# Patient Record
Sex: Male | Born: 1935 | Race: White | Hispanic: No | Marital: Married
Health system: Southern US, Community
[De-identification: ages and names within clinical notes are randomized; demographics above are authoritative.]

## PROBLEM LIST (undated history)

## (undated) DIAGNOSIS — I701 Atherosclerosis of renal artery: Secondary | ICD-10-CM

## (undated) DIAGNOSIS — I2510 Atherosclerotic heart disease of native coronary artery without angina pectoris: Secondary | ICD-10-CM

## (undated) DIAGNOSIS — E785 Hyperlipidemia, unspecified: Secondary | ICD-10-CM

## (undated) DIAGNOSIS — K859 Acute pancreatitis without necrosis or infection, unspecified: Secondary | ICD-10-CM

## (undated) DIAGNOSIS — I10 Essential (primary) hypertension: Secondary | ICD-10-CM

## (undated) DIAGNOSIS — I679 Cerebrovascular disease, unspecified: Secondary | ICD-10-CM

## (undated) DIAGNOSIS — I5030 Unspecified diastolic (congestive) heart failure: Secondary | ICD-10-CM

## (undated) HISTORY — DX: Unspecified diastolic (congestive) heart failure: I50.30

## (undated) HISTORY — PX: POLYPECTOMY: SHX149

## (undated) HISTORY — DX: Atherosclerotic heart disease of native coronary artery without angina pectoris: I25.10

## (undated) HISTORY — DX: Essential (primary) hypertension: I10

## (undated) HISTORY — DX: Cerebrovascular disease, unspecified: I67.9

## (undated) HISTORY — DX: Hyperlipidemia, unspecified: E78.5

## (undated) HISTORY — PX: CHOLECYSTECTOMY: SHX55

## (undated) HISTORY — DX: Atherosclerosis of renal artery: I70.1

## (undated) HISTORY — DX: Acute pancreatitis without necrosis or infection, unspecified: K85.90

## (undated) HISTORY — PX: INGUINAL HERNIA REPAIR: SUR1180

---

## 1995-10-26 HISTORY — PX: CORONARY ARTERY BYPASS GRAFT: SHX141

## 2003-05-23 ENCOUNTER — Ambulatory Visit (HOSPITAL_COMMUNITY): Admission: RE | Admit: 2003-05-23 | Discharge: 2003-05-23 | Payer: Self-pay | Admitting: Gastroenterology

## 2003-10-30 ENCOUNTER — Ambulatory Visit (HOSPITAL_COMMUNITY): Admission: RE | Admit: 2003-10-30 | Discharge: 2003-10-30 | Payer: Self-pay | Admitting: *Deleted

## 2003-12-10 ENCOUNTER — Inpatient Hospital Stay (HOSPITAL_COMMUNITY): Admission: EM | Admit: 2003-12-10 | Discharge: 2003-12-18 | Payer: Self-pay | Admitting: Emergency Medicine

## 2003-12-13 ENCOUNTER — Encounter: Payer: Self-pay | Admitting: Cardiology

## 2004-09-30 ENCOUNTER — Ambulatory Visit: Payer: Self-pay | Admitting: *Deleted

## 2004-10-27 ENCOUNTER — Ambulatory Visit: Payer: Self-pay | Admitting: *Deleted

## 2004-11-10 ENCOUNTER — Ambulatory Visit: Payer: Self-pay | Admitting: Internal Medicine

## 2005-01-06 ENCOUNTER — Ambulatory Visit: Payer: Self-pay | Admitting: *Deleted

## 2005-03-01 ENCOUNTER — Ambulatory Visit: Payer: Self-pay | Admitting: *Deleted

## 2005-09-30 ENCOUNTER — Ambulatory Visit: Payer: Self-pay | Admitting: Cardiology

## 2005-11-03 ENCOUNTER — Emergency Department (HOSPITAL_COMMUNITY): Admission: EM | Admit: 2005-11-03 | Discharge: 2005-11-03 | Payer: Self-pay | Admitting: Emergency Medicine

## 2005-11-05 ENCOUNTER — Ambulatory Visit: Payer: Self-pay | Admitting: Internal Medicine

## 2005-11-09 ENCOUNTER — Ambulatory Visit: Payer: Self-pay | Admitting: Cardiology

## 2005-12-01 ENCOUNTER — Ambulatory Visit: Payer: Self-pay | Admitting: Cardiology

## 2006-02-03 ENCOUNTER — Emergency Department (HOSPITAL_COMMUNITY): Admission: EM | Admit: 2006-02-03 | Discharge: 2006-02-03 | Payer: Self-pay | Admitting: Emergency Medicine

## 2006-02-18 ENCOUNTER — Ambulatory Visit: Payer: Self-pay

## 2006-10-03 ENCOUNTER — Ambulatory Visit: Payer: Self-pay | Admitting: Cardiology

## 2007-09-27 ENCOUNTER — Ambulatory Visit: Payer: Self-pay | Admitting: Cardiology

## 2007-09-27 LAB — CONVERTED CEMR LAB
Basophils Relative: 0.5 % (ref 0.0–1.0)
Eosinophils Relative: 3.2 % (ref 0.0–5.0)
Lymphocytes Relative: 22.5 % (ref 12.0–46.0)
Platelets: 96 10*3/uL — ABNORMAL LOW (ref 150–400)
RBC: 4.17 M/uL — ABNORMAL LOW (ref 4.22–5.81)
RDW: 13.7 % (ref 11.5–14.6)
WBC: 4.6 10*3/uL (ref 4.5–10.5)

## 2007-10-09 ENCOUNTER — Ambulatory Visit: Payer: Self-pay

## 2007-11-13 ENCOUNTER — Ambulatory Visit: Payer: Self-pay | Admitting: Oncology

## 2007-11-28 LAB — CBC WITH DIFFERENTIAL/PLATELET
Basophils Absolute: 0 10*3/uL (ref 0.0–0.1)
Eosinophils Absolute: 0.1 10*3/uL (ref 0.0–0.5)
HGB: 13.6 g/dL (ref 13.0–17.1)
LYMPH%: 16.3 % (ref 14.0–48.0)
MONO#: 0.2 10*3/uL (ref 0.1–0.9)
NEUT#: 3.7 10*3/uL (ref 1.5–6.5)
Platelets: 98 10*3/uL — ABNORMAL LOW (ref 145–400)
RBC: 4.29 10*6/uL (ref 4.20–5.71)
RDW: 14.2 % (ref 11.2–14.6)
WBC: 4.8 10*3/uL (ref 4.0–10.0)

## 2007-11-28 LAB — COMPREHENSIVE METABOLIC PANEL
Albumin: 4.6 g/dL (ref 3.5–5.2)
BUN: 22 mg/dL (ref 6–23)
CO2: 27 mEq/L (ref 19–32)
Glucose, Bld: 97 mg/dL (ref 70–99)
Potassium: 4.5 mEq/L (ref 3.5–5.3)
Sodium: 141 mEq/L (ref 135–145)
Total Protein: 7 g/dL (ref 6.0–8.3)

## 2007-11-28 LAB — LACTATE DEHYDROGENASE: LDH: 144 U/L (ref 94–250)

## 2008-01-03 ENCOUNTER — Ambulatory Visit: Payer: Self-pay | Admitting: Cardiology

## 2008-01-10 IMAGING — CR DG CERVICAL SPINE COMPLETE 4+V
6 series · 6 of 6 positions shown · non-contrast
Comparison: none

CLINICAL DATA: Syncopal episode, landed on back. 
 THORACIC SPINE - 2 VIEW:
 Two views of the thoracic spine were obtained.  The bones are diffusely osteopenic.  There is degenerative change present but no acute compression deformity is seen.

[w c-spine lat]
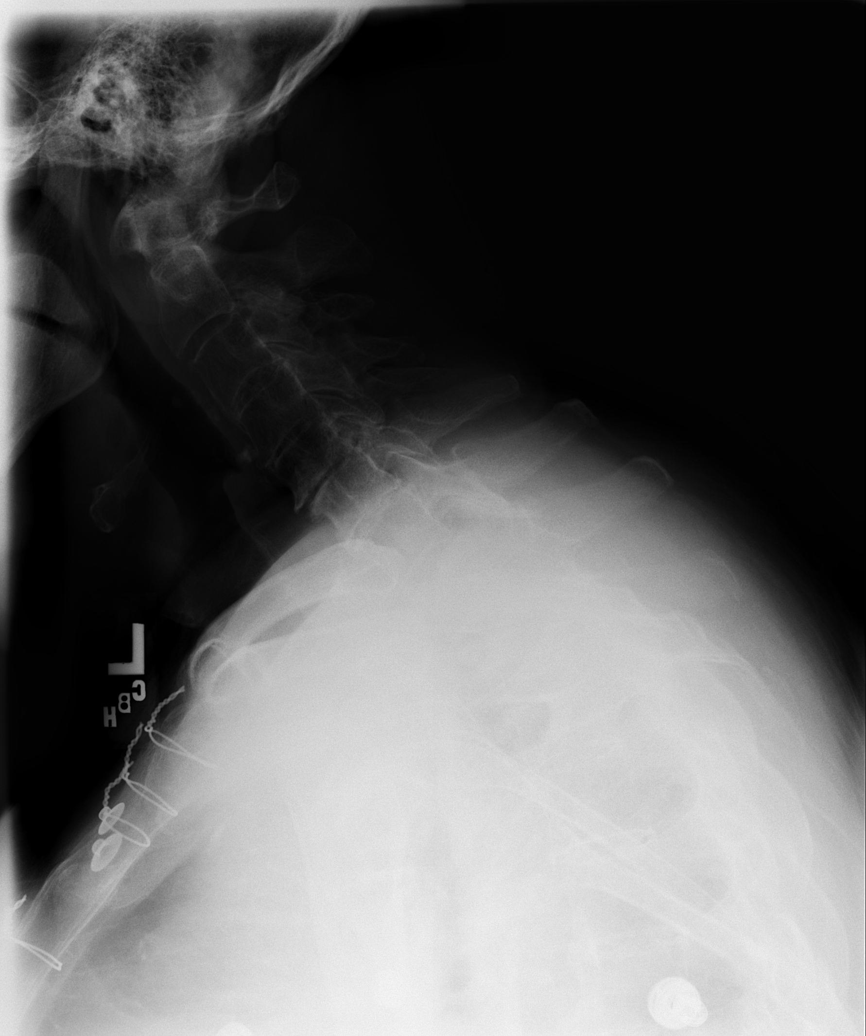

[w c-spine oblique (1 of 2)]
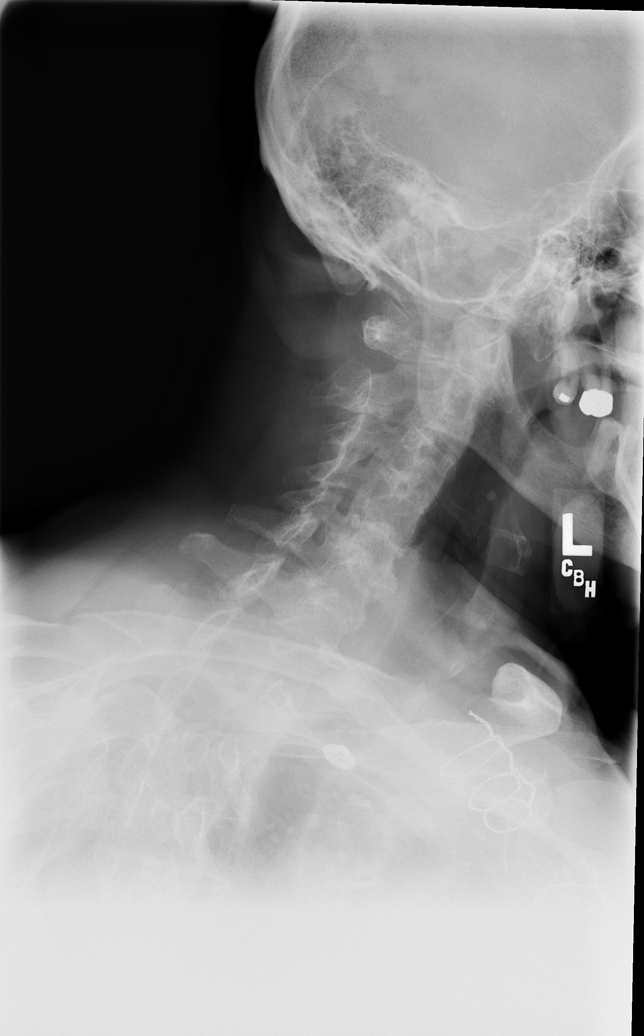

[w c-spine oblique (2 of 2)]
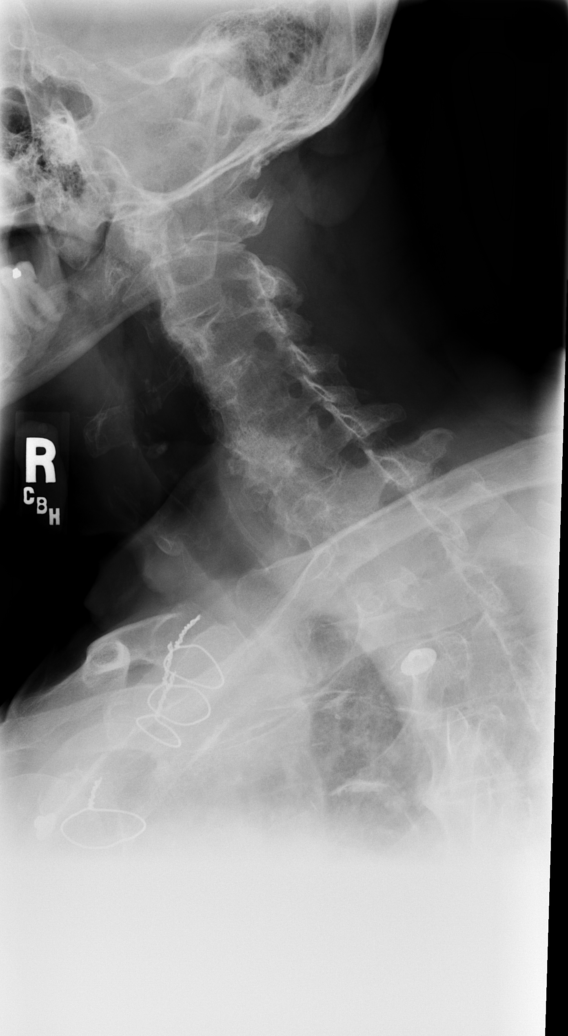

[w c-spine a.p.]
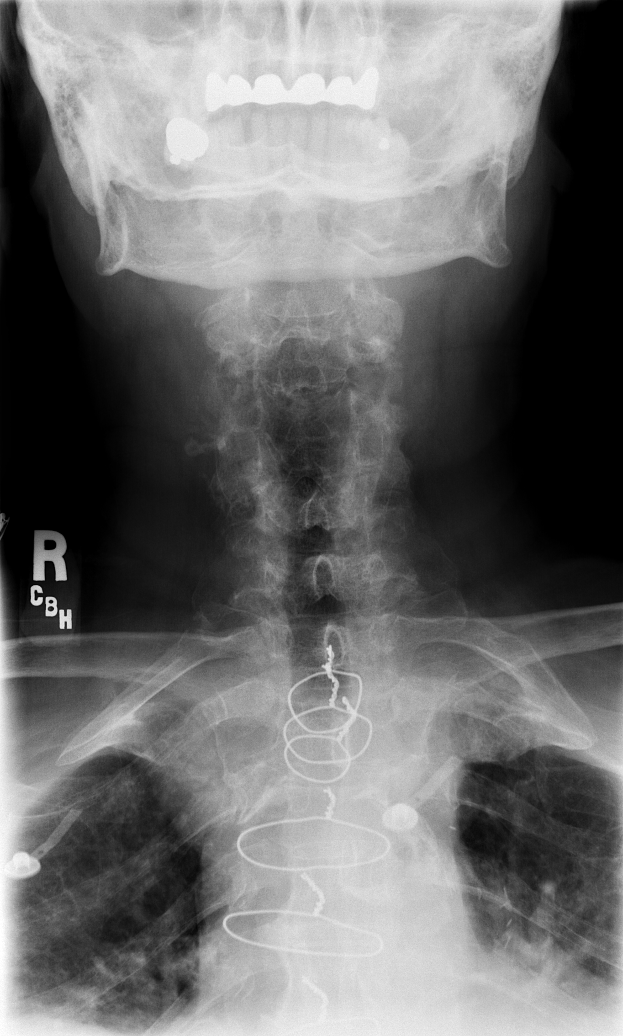

[w c-spine odontoid]
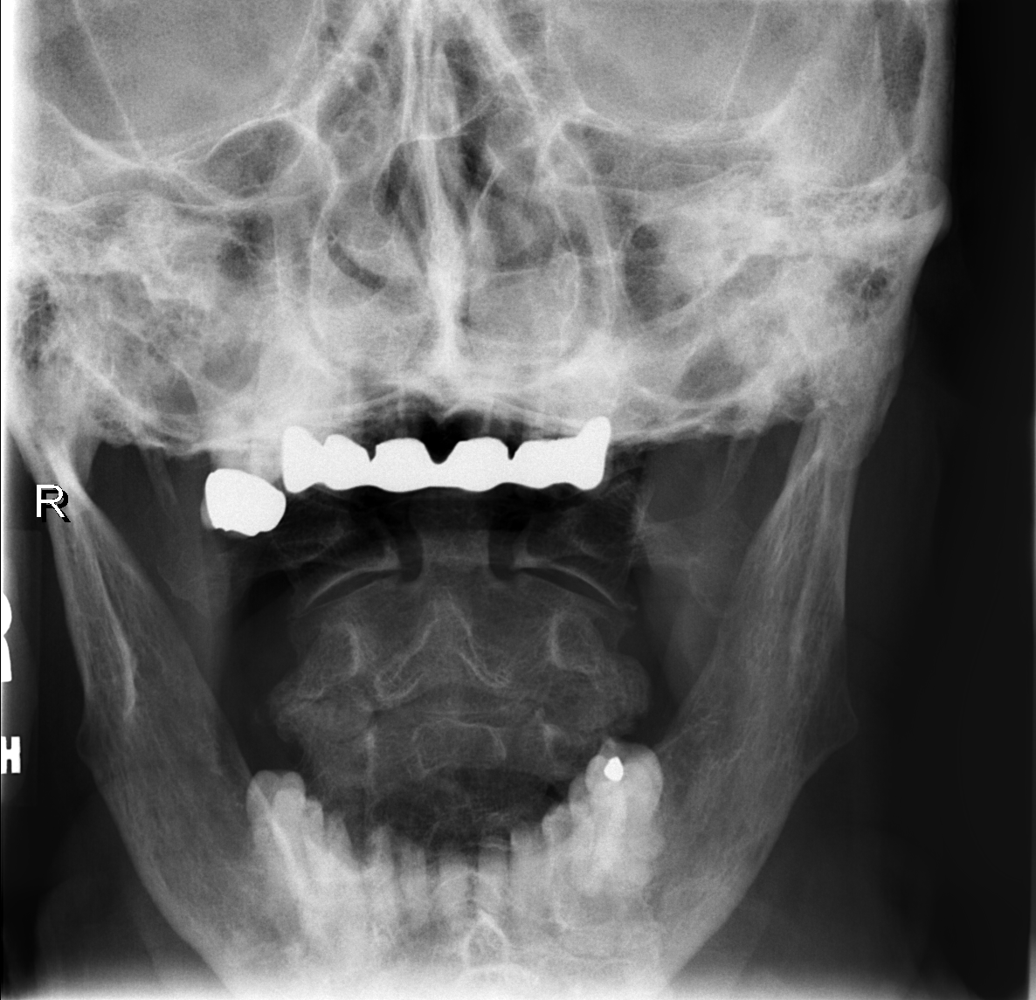

[w swimmers view]
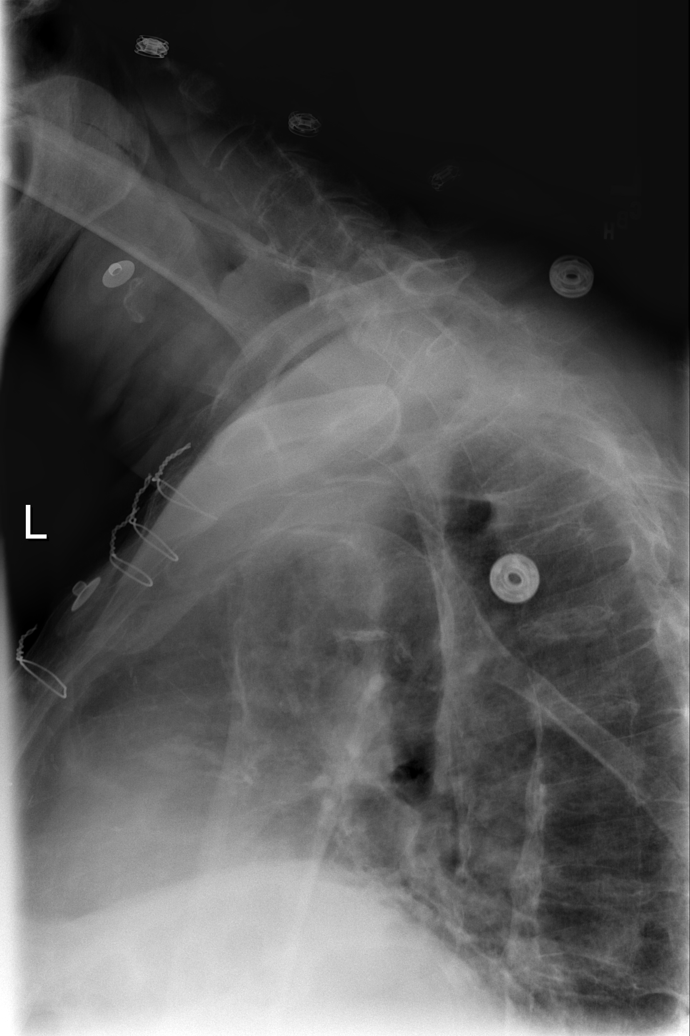

[6 of 6 positions shown; findings below may reference images not displayed]

IMPRESSION: Diffuse osteopenia.  No acute compression deformity. 
 CERVICAL SPINE - 5 VIEW:
 Five views of the cervical spine were obtained. There is degenerative disk disease particularly at C4-5, C5-6 and C6-7 levels.  The cervical vertebrae are in normal alignment.  On oblique views, there is foraminal narrowing particularly at C5-6 bilaterally.  The odontoid process is intact.
IMPRESSION: No acute abnormality.  Degenerative change at C4-5, C5-6, and C6-7 with foraminal narrowing particularly C5-6 bilaterally.

## 2008-01-24 ENCOUNTER — Ambulatory Visit: Payer: Self-pay

## 2008-03-25 ENCOUNTER — Ambulatory Visit: Payer: Self-pay | Admitting: Oncology

## 2008-03-27 LAB — COMPREHENSIVE METABOLIC PANEL
AST: 18 U/L (ref 0–37)
Albumin: 4.6 g/dL (ref 3.5–5.2)
Alkaline Phosphatase: 71 U/L (ref 39–117)
BUN: 26 mg/dL — ABNORMAL HIGH (ref 6–23)
Creatinine, Ser: 1.42 mg/dL (ref 0.40–1.50)
Glucose, Bld: 110 mg/dL — ABNORMAL HIGH (ref 70–99)
Potassium: 4.8 mEq/L (ref 3.5–5.3)
Total Bilirubin: 1.3 mg/dL — ABNORMAL HIGH (ref 0.3–1.2)

## 2008-03-27 LAB — CBC WITH DIFFERENTIAL/PLATELET
Basophils Absolute: 0 10*3/uL (ref 0.0–0.1)
Eosinophils Absolute: 0.1 10*3/uL (ref 0.0–0.5)
HCT: 37.2 % — ABNORMAL LOW (ref 38.7–49.9)
HGB: 13.3 g/dL (ref 13.0–17.1)
MCH: 31.3 pg (ref 28.0–33.4)
MONO#: 0.2 10*3/uL (ref 0.1–0.9)
NEUT#: 3 10*3/uL (ref 1.5–6.5)
NEUT%: 73.4 % (ref 40.0–75.0)
RDW: 14.5 % (ref 11.2–14.6)
WBC: 4.1 10*3/uL (ref 4.0–10.0)
lymph#: 0.8 10*3/uL — ABNORMAL LOW (ref 0.9–3.3)

## 2008-07-03 ENCOUNTER — Ambulatory Visit: Payer: Self-pay | Admitting: Cardiology

## 2008-08-09 ENCOUNTER — Ambulatory Visit: Payer: Self-pay

## 2008-09-24 ENCOUNTER — Ambulatory Visit: Payer: Self-pay | Admitting: Oncology

## 2008-11-19 DIAGNOSIS — I2510 Atherosclerotic heart disease of native coronary artery without angina pectoris: Secondary | ICD-10-CM | POA: Insufficient documentation

## 2008-11-19 DIAGNOSIS — I6529 Occlusion and stenosis of unspecified carotid artery: Secondary | ICD-10-CM

## 2008-11-19 DIAGNOSIS — E7800 Pure hypercholesterolemia, unspecified: Secondary | ICD-10-CM

## 2008-11-19 DIAGNOSIS — I701 Atherosclerosis of renal artery: Secondary | ICD-10-CM | POA: Insufficient documentation

## 2008-11-19 DIAGNOSIS — D696 Thrombocytopenia, unspecified: Secondary | ICD-10-CM | POA: Insufficient documentation

## 2008-11-19 DIAGNOSIS — I10 Essential (primary) hypertension: Secondary | ICD-10-CM | POA: Insufficient documentation

## 2008-11-19 DIAGNOSIS — I509 Heart failure, unspecified: Secondary | ICD-10-CM | POA: Insufficient documentation

## 2009-01-17 ENCOUNTER — Encounter: Payer: Self-pay | Admitting: Cardiology

## 2009-02-19 ENCOUNTER — Encounter: Payer: Self-pay | Admitting: Cardiology

## 2009-02-19 ENCOUNTER — Ambulatory Visit: Payer: Self-pay | Admitting: Cardiology

## 2009-02-19 DIAGNOSIS — R0602 Shortness of breath: Secondary | ICD-10-CM

## 2009-02-24 ENCOUNTER — Telehealth (INDEPENDENT_AMBULATORY_CARE_PROVIDER_SITE_OTHER): Payer: Self-pay | Admitting: *Deleted

## 2009-02-25 ENCOUNTER — Ambulatory Visit: Payer: Self-pay | Admitting: Cardiology

## 2009-02-25 ENCOUNTER — Ambulatory Visit: Payer: Self-pay

## 2009-02-26 ENCOUNTER — Encounter (INDEPENDENT_AMBULATORY_CARE_PROVIDER_SITE_OTHER): Payer: Self-pay | Admitting: *Deleted

## 2009-04-23 ENCOUNTER — Encounter: Payer: Self-pay | Admitting: Cardiology

## 2009-04-23 ENCOUNTER — Ambulatory Visit: Payer: Self-pay | Admitting: Cardiology

## 2009-04-23 DIAGNOSIS — R079 Chest pain, unspecified: Secondary | ICD-10-CM

## 2009-05-02 ENCOUNTER — Encounter: Payer: Self-pay | Admitting: Cardiology

## 2009-08-12 ENCOUNTER — Encounter: Payer: Self-pay | Admitting: Cardiology

## 2009-08-12 ENCOUNTER — Ambulatory Visit: Payer: Self-pay

## 2009-08-14 ENCOUNTER — Encounter: Payer: Self-pay | Admitting: Cardiology

## 2009-08-29 ENCOUNTER — Encounter: Payer: Self-pay | Admitting: Cardiology

## 2009-09-24 ENCOUNTER — Ambulatory Visit: Payer: Self-pay | Admitting: Cardiology

## 2009-09-24 ENCOUNTER — Encounter: Payer: Self-pay | Admitting: Cardiology

## 2010-04-08 ENCOUNTER — Ambulatory Visit: Payer: Self-pay | Admitting: Cardiology

## 2010-04-08 ENCOUNTER — Encounter: Payer: Self-pay | Admitting: Cardiology

## 2010-08-11 ENCOUNTER — Encounter: Payer: Self-pay | Admitting: Cardiology

## 2010-08-12 ENCOUNTER — Encounter: Payer: Self-pay | Admitting: Cardiology

## 2010-08-12 ENCOUNTER — Ambulatory Visit: Payer: Self-pay

## 2010-08-27 ENCOUNTER — Encounter: Payer: Self-pay | Admitting: Cardiology

## 2010-11-04 ENCOUNTER — Ambulatory Visit
Admission: RE | Admit: 2010-11-04 | Discharge: 2010-11-04 | Payer: Self-pay | Source: Home / Self Care | Attending: Cardiology | Admitting: Cardiology

## 2010-11-04 ENCOUNTER — Encounter: Payer: Self-pay | Admitting: Cardiology

## 2010-11-04 DIAGNOSIS — R5383 Other fatigue: Secondary | ICD-10-CM

## 2010-11-04 DIAGNOSIS — N259 Disorder resulting from impaired renal tubular function, unspecified: Secondary | ICD-10-CM | POA: Insufficient documentation

## 2010-11-04 DIAGNOSIS — R5381 Other malaise: Secondary | ICD-10-CM | POA: Insufficient documentation

## 2010-11-22 LAB — CONVERTED CEMR LAB
BUN: 20 mg/dL (ref 6–23)
CO2: 30 meq/L (ref 19–32)
Chloride: 108 meq/L (ref 96–112)
Glucose, Bld: 104 mg/dL — ABNORMAL HIGH (ref 70–99)
Potassium: 4.4 meq/L (ref 3.5–5.1)

## 2010-11-24 NOTE — Miscellaneous (Signed)
Summary: Orders Update  Clinical Lists Changes  Orders: Added new Test order of Carotid Duplex (Carotid Duplex) - Signed 

## 2010-11-24 NOTE — Assessment & Plan Note (Signed)
Summary: [name]   Visit Type:  Follow-up  CC:  Tiredness.  History of Present Illness: [name] is a very pleasant 77 year old gentleman who has a history of coronary artery disease, status post coronary bypassing graft as well as 70% left renal artery.  He also has moderate cerebrovascular disease.  His last  carotid Dopplers were performed in October of 2010.  He had a 40-59% right and 0-39% left stenosis. Followup was recommended in one year. Last Celine Ahr was performed on Feb 25, 2009. His ejection fraction was 61% and there was inferior thinning but no sign of scar or ischemia. I last saw him in December of 2010. Since then he denies dyspnea, chest pain, palpitations or syncope. He does have some fatigue.  Current Medications (verified): 1)  Isosorbide Mononitrate Cr 60 Mg Xr24h-Tab (Isosorbide Mononitrate) .... One Tablet Daily 2)  Nexium 40 Mg Cpdr (Esomeprazole Magnesium) .... One Tablet Daily 3)  Felodipine 10 Mg Xr24h-Tab (Felodipine) .... One Tablet Daily 4)  Terazosin Hcl 10 Mg Caps (Terazosin Hcl) .... One Tablet Daily 5)  Pravachol 40 Mg Tabs (Pravastatin Sodium) .... One Tablet Daily 6)  Micardis Hct 40-12.5 Mg Tabs (Telmisartan-Hctz) .... Two Tablets By Mouth Once Daily 7)  Aspirin 81 Mg Tbec (Aspirin) .... Take One Tablet By Mouth Daily 8)  Cvs Folic Acid 400 Mcg Tabs (Folic Acid) .... One Tablet Daily 9)  Cosopt 2-0.5 % Soln (Dorzolamide-Timolol) .... One Gtt Both Eyes Two Times A Day  Allergies (verified): No Known Drug Allergies  Past History:  Past Medical History: Reviewed history from 11/19/2008 and no changes required. CAD Cerebrovascular Disease Congestive Heart Failure diastolic Hyperlipidemia Hypertension glaucoma Renal artery stenosis Pancreatitis  Past Surgical History: Reviewed history from 11/19/2008 and no changes required. s/p cabg 1997 (LIMA to the LAD, saphenous vein graft to the ramus intermedius, saphenous vein graft to the first  OM, saphenous vein graft to the second OM, something graft to the PDA) s/p cholecystectomy hx r and l inguinal hernia  repair hx polypectomy  Social History: Reviewed history from 09/24/2009 and no changes required. Retired  Married  Alcohol Use - no Drug Use - no children 1 son 2 stepdaughters Tobacco Use - No.   Review of Systems       no fevers or chills, productive cough, hemoptysis, dysphasia, odynophagia, melena, hematochezia, dysuria, hematuria, rash, seizure activity, orthopnea, PND, pedal edema, claudication. Remaining systems are negative.   Vital Signs:  Patient profile:   77 year old male Height:      67 inches Weight:      170 pounds BMI:     26.72 Pulse rate:   61 / minute Pulse rhythm:   regular Resp:     18 per minute BP sitting:   142 / 70  (right arm) Cuff size:   large  Vitals Entered By: Vikki Ports (April 08, 2010 4:38 PM)  Physical Exam  General:  Well-developed well-nourished in no acute distress.  Skin is warm and dry.  HEENT is normal.  Neck is supple. No thyromegaly. Bilateral bruits Chest is clear to auscultation with normal expansion.  Cardiovascular exam is regular rate and rhythm. 2/6 SEM Abdominal exam nontender or distended. No masses palpated. Extremities show trace edema. neuro grossly intact    EKG  Procedure date:  04/08/2010  Findings:      Sinus rhythm at a rate of 61. Occasional PACs. Nonspecific ST changes.  Impression & Recommendations:  Problem # 1:  CAROTID ARTERY STENOSIS (  ICD-433.10) Continue aspirin and statin. I discussed followup studies with the patient today. Given his age I am not convinced there is a benefit in continuing this. He also most likely would not consider surgery in the future. We will therefore treat medically with no followup carotid Dopplers. His updated medication list for this problem includes:    Aspirin 81 Mg Tbec (Aspirin) .Marland Kitchen... Take one tablet by mouth daily  Problem # 2:  RENAL  ARTERY STENOSIS (ICD-440.1) Continue aspirin and statin.  Problem # 3:  ESSENTIAL HYPERTENSION, BENIGN (ICD-401.1) Blood pressure controlled on present medications. Will continue. His updated medication list for this problem includes:    Felodipine 10 Mg Xr24h-tab (Felodipine) ..... One tablet daily    Terazosin Hcl 10 Mg Caps (Terazosin hcl) ..... One tablet daily    Micardis Hct 40-12.5 Mg Tabs (Telmisartan-hctz) .Marland Kitchen..Marland Kitchen Two tablets by mouth once daily    Aspirin 81 Mg Tbec (Aspirin) .Marland Kitchen... Take one tablet by mouth daily  Problem # 4:  HYPERCHOLESTEROLEMIA-PURE (ICD-272.0) Continue statin. Lipids and liver monitored by primary care. His updated medication list for this problem includes:    Pravachol 40 Mg Tabs (Pravastatin sodium) ..... One tablet daily  Problem # 5:  CORONARY ATHEROSCLEROSIS NATIVE CORONARY ARTERY (ICD-414.01) Continue aspirin and statin. Last Myoview low risk. His updated medication list for this problem includes:    Isosorbide Mononitrate Cr 60 Mg Xr24h-tab (Isosorbide mononitrate) ..... One tablet daily    Felodipine 10 Mg Xr24h-tab (Felodipine) ..... One tablet daily    Aspirin 81 Mg Tbec (Aspirin) .Marland Kitchen... Take one tablet by mouth daily

## 2010-11-26 NOTE — Assessment & Plan Note (Signed)
Summary: [facility]   Visit Type:  Follow-up  CC:  Tiredness.  History of Present Illness: Mr. [name] is a very pleasant 77 year old gentleman who has a history of coronary artery disease, status post coronary bypassing graft as well as 70% left renal artery.  He also has moderate cerebrovascular disease.  His last  carotid Dopplers were performed in October of 2011.  He had a 40-59% right and 0-39% left stenosis. Followup was recommended in one year. Last Celine Ahr was performed on Feb 25, 2009. His ejection fraction was 61% and there was inferior thinning but no sign of scar or ischemia. I last saw him in June of 2011. Since then he denies dyspnea, chest pain, palpitations or syncope. He does have some fatigue which appears to be worse.  Current Medications (verified): 1)  Isosorbide Mononitrate Cr 60 Mg Xr24h-Tab (Isosorbide Mononitrate) .... One Tablet Daily 2)  Nexium 40 Mg Cpdr (Esomeprazole Magnesium) .... One Tablet Daily 3)  Felodipine 10 Mg Xr24h-Tab (Felodipine) .... One Tablet Daily 4)  Terazosin Hcl 10 Mg Caps (Terazosin Hcl) .... One Tablet Daily 5)  Pravachol 40 Mg Tabs (Pravastatin Sodium) .... One Tablet Daily 6)  Micardis Hct 40-12.5 Mg Tabs (Telmisartan-Hctz) .... Two Tablets By Mouth Once Daily 7)  Aspirin 81 Mg Tbec (Aspirin) .... Take One Tablet By Mouth Daily 8)  Cvs Folic Acid 400 Mcg Tabs (Folic Acid) .... One Tablet Daily 9)  Cosopt 2-0.5 % Soln (Dorzolamide-Timolol) .... One Gtt Both Eyes Two Times A Day 10)  Multivitamins  Tabs (Multiple Vitamin) .... Take 1 Tablet By Mouth Once A Day  Allergies (verified): No Known Drug Allergies  Past History:  Past Medical History: Reviewed history from 11/19/2008 and no changes required. CAD Cerebrovascular Disease Congestive Heart Failure diastolic Hyperlipidemia Hypertension glaucoma Renal artery stenosis Pancreatitis  Past Surgical History: Reviewed history from 11/19/2008 and no changes required. s/p  cabg 1997 (LIMA to the LAD, saphenous vein graft to the ramus intermedius, saphenous vein graft to the first OM, saphenous vein graft to the second OM, something graft to the PDA) s/p cholecystectomy hx r and l inguinal hernia  repair hx polypectomy  Social History: Reviewed history from 09/24/2009 and no changes required. Retired  Married  Alcohol Use - no Drug Use - no children 1 son 2 stepdaughters Tobacco Use - No.   Review of Systems       Fatigue but no fevers or chills, productive cough, hemoptysis, dysphasia, odynophagia, melena, hematochezia, dysuria, hematuria, rash, seizure activity, orthopnea, PND,  claudication. Remaining systems are negative.   Vital Signs:  Patient profile:   77 year old male Height:      67 inches Weight:      167 pounds BMI:     26.25 Pulse rate:   56 / minute Pulse rhythm:   regular Resp:     18 per minute BP sitting:   150 / 58  (left arm) Cuff size:   large  Vitals Entered By: Vikki Ports (November 04, 2010 4:29 PM)  Physical Exam  General:  Well-developed frail in no acute distress.  Skin is warm and dry.  HEENT is normal.  Neck is supple. No thyromegaly.  Chest is clear to auscultation with normal expansion.  Cardiovascular exam is regular rate and rhythm.  Abdominal exam nontender or distended. No masses palpated. Extremities show trace edema. neuro grossly intact    Impression & Recommendations:  Problem # 1:  CORONARY ATHEROSCLEROSIS NATIVE CORONARY ARTERY (ICD-414.01) Continue aspirin and  statin. His updated medication list for this problem includes:    Isosorbide Mononitrate Cr 60 Mg Xr24h-tab (Isosorbide mononitrate) ..... One tablet daily    Felodipine 10 Mg Xr24h-tab (Felodipine) ..... One tablet daily    Aspirin 81 Mg Tbec (Aspirin) .Marland Kitchen... Take one tablet by mouth daily  Problem # 2:  HYPERCHOLESTEROLEMIA-PURE (ICD-272.0) Continue statin. Lipids and liver monitored by primary care. His updated medication list  for this problem includes:    Pravachol 40 Mg Tabs (Pravastatin sodium) ..... One tablet daily  Problem # 3:  ESSENTIAL HYPERTENSION, BENIGN (ICD-401.1) Blood pressure reasonable. Will not advanced medications given age. Potassium and renal function monitored by primary care. His updated medication list for this problem includes:    Felodipine 10 Mg Xr24h-tab (Felodipine) ..... One tablet daily    Terazosin Hcl 10 Mg Caps (Terazosin hcl) ..... One tablet daily    Micardis Hct 40-12.5 Mg Tabs (Telmisartan-hctz) .Marland Kitchen..Marland Kitchen Two tablets by mouth once daily    Aspirin 81 Mg Tbec (Aspirin) .Marland Kitchen... Take one tablet by mouth daily  Problem # 4:  CAROTID ARTERY STENOSIS (ICD-433.10) Continue aspirin and statin. His updated medication list for this problem includes:    Aspirin 81 Mg Tbec (Aspirin) .Marland Kitchen... Take one tablet by mouth daily  Problem # 5:  RENAL INSUFFICIENCY (ICD-588.9) Followed by primary care.  Problem # 6:  FATIGUE (ICD-780.79) Check CBC and TSH.  Problem # 7:  RENAL ARTERY STENOSIS (ICD-440.1) Continue aspirin and statin. Conservative measures given age.  Other Orders: T-CBC No Diff ([id number]) T-TSH ([id number])  Patient Instructions: 1)  Your physician wants you to follow-up in: 6 MONTHS  You will receive a reminder letter in the mail two months in advance. If you don't receive a letter, please call our office to schedule the follow-up appointment.  Physical Exam  General:  Well-developed frail in no acute distress.  Skin is warm and dry.  HEENT is normal.  Neck is supple. No thyromegaly.  Chest is clear to auscultation with normal expansion.  Cardiovascular exam is regular rate and rhythm.  Abdominal exam nontender or distended. No masses palpated. Extremities show trace edema. neuro grossly intact    Impression & Recommendations:  Problem # 1:  CORONARY ATHEROSCLEROSIS NATIVE CORONARY ARTERY (ICD-414.01) Continue aspirin and statin. His updated medication list for  this problem includes:    Isosorbide Mononitrate Cr 60 Mg Xr24h-tab (Isosorbide mononitrate) ..... One tablet daily    Felodipine 10 Mg Xr24h-tab (Felodipine) ..... One tablet daily    Aspirin 81 Mg Tbec (Aspirin) .Marland Kitchen... Take one tablet by mouth daily  Problem # 2:  HYPERCHOLESTEROLEMIA-PURE (ICD-272.0) Continue statin. Lipids and liver monitored by primary care. His updated medication list for this problem includes:    Pravachol 40 Mg Tabs (Pravastatin sodium) ..... One tablet daily  Problem # 3:  ESSENTIAL HYPERTENSION, BENIGN (ICD-401.1) Blood pressure reasonable. Will not advanced medications given age. Potassium and renal function monitored by primary care. His updated medication list for this problem includes:    Felodipine 10 Mg Xr24h-tab (Felodipine) ..... One tablet daily    Terazosin Hcl 10 Mg Caps (Terazosin hcl) ..... One tablet daily    Micardis Hct 40-12.5 Mg Tabs (Telmisartan-hctz) .Marland Kitchen..Marland Kitchen Two tablets by mouth once daily    Aspirin 81 Mg Tbec (Aspirin) .Marland Kitchen... Take one tablet by mouth daily  Problem # 4:  CAROTID ARTERY STENOSIS (ICD-433.10) Continue aspirin and statin. His updated medication list for this problem includes:    Aspirin 81 Mg Tbec (Aspirin) .Marland Kitchen... Take  one tablet by mouth daily  Problem # 5:  RENAL INSUFFICIENCY (ICD-588.9) Followed by primary care.  Problem # 6:  FATIGUE (ICD-780.79) Check CBC and TSH.  Problem # 7:  RENAL ARTERY STENOSIS (ICD-440.1) Continue aspirin and statin. Conservative measures given age.  Other Orders: T-CBC No Diff ([id number]) T-TSH ([id number])

## 2010-12-10 ENCOUNTER — Encounter: Payer: Self-pay | Admitting: Cardiology

## 2010-12-30 ENCOUNTER — Encounter: Payer: Self-pay | Admitting: Hematology and Oncology

## 2011-01-19 ENCOUNTER — Encounter (HOSPITAL_BASED_OUTPATIENT_CLINIC_OR_DEPARTMENT_OTHER): Payer: Medicare Other | Admitting: Oncology

## 2011-01-19 ENCOUNTER — Other Ambulatory Visit: Payer: Self-pay | Admitting: Oncology

## 2011-01-19 DIAGNOSIS — D696 Thrombocytopenia, unspecified: Secondary | ICD-10-CM

## 2011-01-19 LAB — COMPREHENSIVE METABOLIC PANEL
AST: 16 U/L (ref 0–37)
Albumin: 4.2 g/dL (ref 3.5–5.2)
Alkaline Phosphatase: 65 U/L (ref 39–117)
BUN: 34 mg/dL — ABNORMAL HIGH (ref 6–23)
Calcium: 9.2 mg/dL (ref 8.4–10.5)
Chloride: 106 mEq/L (ref 96–112)
Creatinine, Ser: 1.82 mg/dL — ABNORMAL HIGH (ref 0.40–1.50)
Glucose, Bld: 98 mg/dL (ref 70–99)

## 2011-01-19 LAB — CBC WITH DIFFERENTIAL/PLATELET
BASO%: 0.3 % (ref 0.0–2.0)
Basophils Absolute: 0 10*3/uL (ref 0.0–0.1)
EOS%: 0.6 % (ref 0.0–7.0)
HCT: 30.5 % — ABNORMAL LOW (ref 38.4–49.9)
HGB: 10.3 g/dL — ABNORMAL LOW (ref 13.0–17.1)
LYMPH%: 24.5 % (ref 14.0–49.0)
MCH: 31.1 pg (ref 27.2–33.4)
MCHC: 33.8 g/dL (ref 32.0–36.0)
NEUT%: 71 % (ref 39.0–75.0)
Platelets: 68 10*3/uL — ABNORMAL LOW (ref 140–400)

## 2011-01-27 ENCOUNTER — Telehealth: Payer: Self-pay | Admitting: Cardiology

## 2011-01-27 NOTE — Telephone Encounter (Signed)
Spoke with pt dtr, pt was seen by a hematologist due to low RBC's. They have decided his bone marrow is slowing down and not producing as many RBC's. They want to start the pt on Aranesp injections to boost his bone marrow. The warnings for aranesp include a lot of cardiovascular conditions and circulatory problems. the pt dtr wants to make sure it is okay for the pt to take the injections. If they do nothing the pt will eventually need a blood transfusion, but they do not know how long before this is needed. Will forward for dr Jens Som review.

## 2011-01-27 NOTE — Telephone Encounter (Signed)
Pt's dtr calling re pt seeing a hematologist and was told hos bone marrow is not producing enough red blood cells and wants him to go on a therapy that his dtr says has side effects for heart problems

## 2011-01-27 NOTE — Telephone Encounter (Signed)
Spoke with pt dtr, she is aware okay for injections

## 2011-01-27 NOTE — Telephone Encounter (Signed)
Ok to use aranesp

## 2011-02-09 ENCOUNTER — Other Ambulatory Visit: Payer: Self-pay | Admitting: Oncology

## 2011-02-09 ENCOUNTER — Encounter (HOSPITAL_BASED_OUTPATIENT_CLINIC_OR_DEPARTMENT_OTHER): Payer: Medicare Other | Admitting: Oncology

## 2011-02-09 DIAGNOSIS — D649 Anemia, unspecified: Secondary | ICD-10-CM

## 2011-02-09 DIAGNOSIS — D696 Thrombocytopenia, unspecified: Secondary | ICD-10-CM

## 2011-02-09 LAB — CBC WITH DIFFERENTIAL/PLATELET
Basophils Absolute: 0 10*3/uL (ref 0.0–0.1)
EOS%: 0.9 % (ref 0.0–7.0)
Eosinophils Absolute: 0 10*3/uL (ref 0.0–0.5)
HCT: 29 % — ABNORMAL LOW (ref 38.4–49.9)
HGB: 10.2 g/dL — ABNORMAL LOW (ref 13.0–17.1)
MCH: 32.9 pg (ref 27.2–33.4)
MONO#: 0.1 10*3/uL (ref 0.1–0.9)
NEUT#: 1.9 10*3/uL (ref 1.5–6.5)
NEUT%: 65.4 % (ref 39.0–75.0)
RDW: 15.1 % — ABNORMAL HIGH (ref 11.0–14.6)
lymph#: 0.8 10*3/uL — ABNORMAL LOW (ref 0.9–3.3)

## 2011-03-08 ENCOUNTER — Other Ambulatory Visit: Payer: Self-pay | Admitting: Oncology

## 2011-03-08 ENCOUNTER — Encounter (HOSPITAL_BASED_OUTPATIENT_CLINIC_OR_DEPARTMENT_OTHER): Payer: Medicare Other | Admitting: Oncology

## 2011-03-08 DIAGNOSIS — D469 Myelodysplastic syndrome, unspecified: Secondary | ICD-10-CM

## 2011-03-08 DIAGNOSIS — D61818 Other pancytopenia: Secondary | ICD-10-CM

## 2011-03-08 DIAGNOSIS — D696 Thrombocytopenia, unspecified: Secondary | ICD-10-CM

## 2011-03-08 LAB — CBC WITH DIFFERENTIAL/PLATELET
Basophils Absolute: 0 10*3/uL (ref 0.0–0.1)
EOS%: 1 % (ref 0.0–7.0)
Eosinophils Absolute: 0 10*3/uL (ref 0.0–0.5)
HCT: 32.7 % — ABNORMAL LOW (ref 38.4–49.9)
HGB: 11.2 g/dL — ABNORMAL LOW (ref 13.0–17.1)
LYMPH%: 28.3 % (ref 14.0–49.0)
MCH: 32.6 pg (ref 27.2–33.4)
MCV: 95.1 fL (ref 79.3–98.0)
MONO%: 3.1 % (ref 0.0–14.0)
NEUT#: 1.7 10*3/uL (ref 1.5–6.5)
NEUT%: 67.4 % (ref 39.0–75.0)
Platelets: 82 10*3/uL — ABNORMAL LOW (ref 140–400)
RDW: 15.4 % — ABNORMAL HIGH (ref 11.0–14.6)

## 2011-03-09 NOTE — Assessment & Plan Note (Signed)
[facility]                            CARDIOLOGY OFFICE NOTE   [name], [name]                      MRN:          [id number]  DATE:09/27/2007                            DOB:          11-Nov-1915    [name] is an 77 year old gentleman previously followed by Dr.  Samule Ohm.  He has a history of coronary artery disease status post  coronary artery bypassing graft in the mid 90s.  His most recent  catheterization was performed in 2005.  At that time he was found to  have an ejection fraction of 60%.  There was a 70% left renal artery.  The right common iliac artery had a 50% stenosis.  He had significant 3-  vessel coronary artery disease.  However, the LIMA to the LAD was  patent, the saphenous vein graft to the ramus intermedius was patent and  the saphenous vein graft to the obtuse marginal was patent.  There was  also saphenous vein graft to the second obtuse marginal that was patent  and the saphenous vein graft to the PDA was also patent.  The patient  has been treated medically.  Since he was last seen in December 2007 he  has done reasonably well.  He does have some dyspnea on exertion but  there is no orthopnea or PND.  He occasionally has pedal edema.  He has  not had chest pain, presyncope or syncope.  Note, he has not taken his  medications this morning.   PRESENT MEDICATIONS:  1. Imdur 60 mg p.o. daily.  2. Plavix 75 mg p.o. daily.  3. Pravachol 40 mg p.o. daily.  4. Aspirin 81 mg p.o. daily.  5. Folate 400 mg p.o. daily.  6. Multivitamin.  7. Cosopt eye drops.  8. Micardis HCT 40/12.5 mg p.o. daily.  9. Felodipine 10 mg p.o. daily.  10.Terazosin 10 mg p.o. daily.   PHYSICAL EXAMINATION:  Today shows a blood pressure of 204/74 and his  pulse is 50.  Note, on recheck his blood pressure was 210/70.  HEENT:  Normal.  NECK:  Supple.  CHEST:  Clear.  CARDIOVASCULAR:  Reveals a regular rate and rhythm.  ABDOMINAL:  Shows no  tenderness.  EXTREMITIES:  Show 1+ edema.   Electrocardiogram shows a sinus bradycardia at a rate of 53.  There is  left axis deviation.  A prior septal infarct is evident.  Note, there is  also a first degree AV block.  I also have laboratories from Dr. Tiburcio Pea'  office on August 29, 2007.  Patient's renal function was normal with a  BUN and creatinine of 21.3 with a potassium of 4.3.  His liver functions  were normal with the exception of a mildly elevated bilirubin at 1.4.  His LDL was 70.  His TSH was normal.  I also note that platelet count  was decreased at 77,000.   DIAGNOSES:  1. Coronary artery disease -- [name] is doing reasonably well with      no chest pain.  He does have some dyspnea on exertion and has been  almost 4 years since his previous ischemia evaluation.  I will      schedule him for a Myoview for risk stratification.  If it shows no      significant ischemia then we will continue his medical therapy.  He      will continue on his aspirin, Plavix, angiotensin reception blocker      and calcium blocker.  Note, we can not add a beta blocker as his      resting heart rate is 53.  He will also continue on his statin.  2. History of diastolic heart failure.  He will continue on his      present medications.  3. Hypertension -- His blood pressure is elevated today but he did not      take his medications this morning.  We will give clonidine 0.1 mg      in the office.  He will then take his home medications after      arriving at home.  I have asked him to track his blood pressure at      home and keep records and I will see him back in 3 months and we      will adjust his regimen as indicated.  4. Hyperlipidemia -- His most recent lipids were outstanding.  He will      continue on his present dose of statin.  5. History of peripheral vascular disease -- He will continue on      medical therapy.  Note, his renal function is normal.  6. Recent thrombocytopenia  -- The etiology of this not clear.  I will      repeat his CBC today.   We will follow him in [place].     [name] Jens Som, MD, [facility]  Electronically Signed    BSC/MedQ  DD: 09/27/2007  DT: 09/27/2007  Job #: [id number]   cc:   Holley Bouche, M.D.

## 2011-03-09 NOTE — Assessment & Plan Note (Signed)
[facility]                            CARDIOLOGY OFFICE NOTE   TYREEK, CLABO                      MRN:          [id number]  DATE:07/03/2008                            DOB:          [birth year]    Mr. Fleishman is a very pleasant 77 year old gentleman who has a history of  coronary artery disease, status post coronary bypassing graft as well as  70% left renal artery.  He also has moderate cerebrovascular disease.  His last Myoview was performed on October 09, 2007.  At that time, he  was found to have a small area of inferobasal ischemia with an ejection  fraction of 60%.  We have been treating him medically.  When I last saw  him, we also schedule him to have carotid Dopplers performed.  This was  on January 24, 2008.  He had a 40-59% right and 0-39% left stenosis.  Since  he was last seen, he has mild dyspnea on exertion, but there is no  orthopnea or PND.  He occasionally has mild pedal edema.  He has not had  chest pain.  He also has not had palpitations.  He has had brief  episodes where he feels transiently lightheaded.  This lasts for 1-2  seconds and resolves spontaneously.  It is not related to position.  He  has not had frank syncope.  He attributes this to dehydration although  it is not positional.  There is no associated palpitations or chest  pain.  It has been very infrequent.   MEDICATIONS:  1. Imdur 60 mg p.o. daily.  2. Pravachol 40 mg p.o. daily.  3. Aspirin 81 mg p.o. daily.  4. Folate.  5. Multivitamin.  6. Eye drops.  7. Micardis HCT 40/12.5 mg p.o. daily.  8. Felodipine ER 10 mg p.o. daily.  9. Terazosin 10 mg p.o. daily.  10.Nexium 40 mg p.o. daily.   PHYSICAL EXAMINATION:  VITAL SIGNS:  Blood pressure of 134/50 and the  pulse is 73.  He weighs 177 pounds.  HEENT:  Normal.  NECK:  Supple.  He has bilateral carotid bruits.  CHEST:  Clear.  CARDIOVASCULAR:  Regular rhythm.  ABDOMEN:  No tenderness.  EXTREMITIES:   Trace edema.   Electrocardiogram shows a sinus rhythm at a rate of 73.  There are  nonspecific ST changes.   DIAGNOSES:  1. Coronary artery disease, status post coronary bypassing graft - Mr.      Jandreau continues to do well with no chest pain.  His previous      Myoview is at low risk.  We will continue with medical therapy      including his aspirin, ARB, calcium blocker, and statin.  He is not      on a beta-blocker and he has had problems with bradycardia in the      past.  2. Recent dizzy spells  - I wonder whether he may be having      transient bradycardia.  If it is worse in the future, then we will      consider a CardioNet monitor.  Note, he has not had frank syncope.  3. History of diastolic congestive heart failure.  4. Hypertension - His blood pressure is adequately controlled.  5. Hyperlipidemia - He will continue on his statin.  6. Peripheral vascular disease - He will continue on his aspirin and      statin.  His previous renal function has been normal.  Dr. Tiburcio Pea      is following his laboratories.  7. Cerebrovascular disease - He will need followup carotid Dopplers in      October.  8. Thrombocytopenia - This is being followed by hematology.   We will see him back in 6 months.     [name] Jens Som, MD, [facility]  Electronically Signed    BSC/MedQ  DD: 07/03/2008  DT: 07/04/2008  Job #: [id number]   cc:   Holley Bouche, M.D.

## 2011-03-09 NOTE — Assessment & Plan Note (Signed)
[facility]                            CARDIOLOGY OFFICE NOTE   JAMIER, URBAS                      MRN:          [id number]  DATE:01/03/2008                            DOB:          1916/02/06    Mr. Hilscher is a very pleasant gentleman who is 64 and previously  followed by Dr. Samule Ohm.  He has a history of coronary disease status  post coronary bypassing graft in the mid 90s.  He also has a 70% left  renal artery.  Since I last saw him, he has had a Myoview.  This was  performed on October 09, 2007.  His ejection fraction was noted to be  60%.  There is small area of inferobasal ischemia, but we felt that it  was low risk and we are treating medically.  Since I last saw him, he  has not had chest pain or shortness of breath.  He has occasional mild  pedal edema.  He has not had syncope.   MEDICATIONS:  1. Imdur 60 mg p.o. daily.  2. Pravachol 40 mg daily.  3. Aspirin 81 mg daily.  4. Folate 400 mg daily.  5. Multivitamin.  6. Eye drops.  7. Micardis/HCT 40/12.5 daily.  8. Felodipine ER 10 mg daily.  9. Terazosin 10 mg daily.   PHYSICAL EXAMINATION TODAY:  VITAL SIGNS:  Blood pressure of 150/58 and  his pulse is 91.  He weighs 175 pounds.  HEENT:  Normal.  NECK:  Supple.  There are bilateral carotid bruits.  CHEST:  Clear.  CARDIOVASCULAR:  Regular rate and rhythm.  ABDOMEN:  No tenderness.  EXTREMITIES:  Trace to 1+ ankle edema.   DIAGNOSES:  1. Coronary artery disease - Mr. Zenner is doing well from a      symptomatic standpoint with no chest pain.  His Myoview was low      risk and showed preserved LV function.  We will continue with      medical therapy including his aspirin, ARB and calcium blocker.  He      also will continue on a statin.  He is not on a beta blocker, as      his heart rate has been in the 50s in the past.  2. History of diastolic congestive heart failure.  He will continue on      his present medications.  3. Hypertension - his blood pressure is adequately controlled.  4. Hyperlipidemia - will have him continue on his statin.  5. Peripheral vascular disease - we will continue with medical      therapy, and note his renal function is normal.  6. Carotid bruits - we will schedule him to have carotid Dopplers.  7. Thrombocytopenia - he is being evaluated by the hematologist for      this issue, and his Plavix is now discontinued.   FOLLOWUP:  We will see him back in 6 months.   NOTE:  He will continue with risk factor modification including diet and  exercise.   MEDICATIONS:  Thank you     [name]. Jens Som, MD,  [facility]  Electronically Signed    BSC/MedQ  DD: 01/03/2008  DT: 01/04/2008  Job #: [id number]   cc:   Holley Bouche, M.D.

## 2011-03-12 NOTE — Cardiovascular Report (Signed)
[name], [name]                         ACCOUNT NO.:  [id number]   MEDICAL RECORD NO.:  [id number]                   PATIENT TYPE:  OIB   LOCATION:  2861                                 FACILITY:  [facility]   PHYSICIAN:  Carole Binning, M.D. [facility]         DATE OF BIRTH:  Mar 15, 1916   DATE OF PROCEDURE:  10/30/2003  DATE OF DISCHARGE:                              CARDIAC CATHETERIZATION   PROCEDURE PERFORMED:  Left heart catheterization with coronary angiography,  bypass graft angiography, left ventriculography, and abdominal aortography.   INDICATIONS:  Mr. Moreland is an 77 year old male with a history of previous  coronary artery bypass surgery.  He presented to the office with symptoms of  exertional dyspnea and chest pain.  A stress Cardiolite was interpreted as  revealing inferolateral ischemia.  We therefore proceeded with cardiac  catheterization.   PROCEDURAL NOTE:  A 6-French sheath was placed in the right femoral artery.  Native coronary arteries were imaged with 6-French JL4 and JR4 catheters.  The saphenous vein grafts to the ramus intermedius, first obtuse marginal  branch, and posterior descending artery were visualized with a JR4 catheter.  The saphenous vein graft to the second obtuse marginal branch was visualized  with a Mayford Knife' right catheter.  Left internal mammary artery was injected  with an internal mammary catheter.  Left ventriculography and abdominal  aortography were performed with an angled pigtail catheter.  Contrast was  Omnipaque.  There were no complications.   RESULTS:  HEMODYNAMICS:  Left ventricular pressure 152/24.  Aortic pressure 160/56.  There is no  aortic valve gradient.   LEFT VENTRICULOGRAM:  Wall motion is normal.  Ejection fraction estimated at greater than or equal  to 60%.  There is no mitral regurgitation.   ABDOMINAL AORTOGRAM:  Reveals a 70% stenosis in the left renal artery.  Right renal artery is  patent.  There is mild  to moderate atherosclerotic disease of the abdominal  aorta and bilateral iliac arteries.  The right common iliac artery has a 50%  stenosis proximally.   CORONARY ARTERIOGRAPHY:  (Right dominant)  Left main has a distal 40% stenosis.   Left anterior descending artery is heavily calcified with a diffuse 95%  stenosis in the proximal vessel.  The LAD gives rise to a small diagonal  branch.  The distal LAD fills via left internal mammary graft.   Left circumflex has a diffuse 70% stenosis in the mid vessel.  Circumflex  gives rise to a normal sized ramus intermedius which has a diffuse 95%  stenosis proximally.  The distal ramus fills via saphenous vein graft.  Circumflex also gives rise to a normal sized first obtuse marginal branch  and a normal to large second obtuse marginal branch.  These are both 100%  occluded proximally.  However, these branches fill via saphenous vein graft.   Right coronary artery is a dominant vessel.  It is 100% occluded proximally.  The distal  right coronary artery fills via saphenous vein graft.   Left subclavian artery has a 20% stenosis proximally, is otherwise patent.   Left internal mammary artery to the mid LAD is patent throughout its course  filling the mid and distal LAD.   Saphenous vein graft to the ramus intermedius is patent throughout its  course filling a normal sized ramus intermedius.   Saphenous vein graft to the first obtuse marginal branch is patent  throughout its course filling a normal sized first obtuse marginal.   Saphenous vein graft to the second obtuse marginal branch has some ectasia  and what appears to be a venous valve in the mid to distal portion of the  graft but there is no stenotic disease identified.  This graft is otherwise  patent filling a normal to large second obtuse marginal branch.   Saphenous vein graft to the posterior descending artery is patent throughout  its course filling a normal sized posterior  descending artery and two small  posterolateral branches.  There is a 50% stenosis in the origin of the  posterior descending artery proximal to the vein graft insertion.   IMPRESSION:  1. Normal left ventricular systolic function.  2. Moderate peripheral vascular disease as described.  3. Native three vessel coronary artery disease.  4. Status post coronary artery bypass surgery.  All grafts appear patent and     the patient appears to be fully revascularized.   PLAN:  The patient will be continued on medical therapy.                                               Carole Binning, M.D. [facility]    MWP/MEDQ  D:  10/30/2003  T:  10/30/2003  Job:  [id number]   cc:   Holley Bouche, M.D.  [address]  [place], [place] [address]  Fax: [phone]   Cath Lab

## 2011-03-12 NOTE — Assessment & Plan Note (Signed)
[facility]                            CARDIOLOGY OFFICE NOTE   Alex, Frost                      MRN:          [id number]  DATE:10/03/2006                            DOB:          1916/04/02    HISTORY OF PRESENT ILLNESS:  [name] is an 77 year old gentleman with  atherosclerotic coronary disease dating back to the 1980's.  He  underwent a coronary artery bypass grafting in the mid 1990's.  Cardiac  catheterization in 2005 demonstrated patency of all bypass grafts and  normal left ventricular systolic function.   Since I last saw Mr. Bath nine months ago, he has continued to do very  nicely.  He has had no exertional dyspnea, chest discomfort, PND,  orthopnea, edema, palpitations, syncope or presyncope.  In short, he  feels himself to be doing very well.   CURRENT MEDICATIONS:  1. Zantac 150 mg b.i.d.  2. Imdur 60 mg daily.  3. Terazosin 5 mg daily.  4. Plavix 75 mg daily.  5. Pravastatin 40 mg daily.  6. Aspirin 81 mg daily.  7. Folate 400 mg daily.  8. Multivitamin.  9. Cosopt eye drops.  10.Micardis/HCTZ 40/12.5 one per day.  11.Felodipine 10 mg daily.   PHYSICAL EXAMINATION:  GENERAL:  Well-appearing in no distress.  VITAL SIGNS:  Heart rate 63, blood pressure 142/52, weight 176 pounds.  NECK:  He has no JVD, no thyromegaly.  There is no lymphadenopathy.  LUNGS:  Clear to auscultation.  Respiratory effort is normal.  HEART:  He has a nondisplaced point of maximal cardiac impulse.  There  is a regular rate and rhythm without murmurs, rubs or gallops.  ABDOMEN:  Soft, nontender, nondistended.  There is no  hepatosplenomegaly.  Bowel sounds are normal.  No midline pulsatile mass  and no abdominal bruit.  EXTREMITIES:  Warm with minimal edema on the right and none on the left.  Carotid pulses 2+ bilaterally without bruits.   STUDIES:  Electrocardiogram demonstrates normal sinus rhythm with sinus  arrhythmia.  Borderline  first degree AV block.  PR interval 202  milliseconds, poor R wave progression across the precordium.  No change  compared with December 01, 2005.   IMPRESSION/RECOMMENDATIONS:  1. Coronary disease.  Stable, asymptomatic after surgical      revascularization a decade ago.  EF is normal.  Continue secondary      prevention with aspirin and Plavix and ACE inhibitor.  2. Diastolic heart failure.  Continue diuretic and ACE inhibitor.      Currently, New York Heart Association class II.  3. Hypercholesterolemia, followed by Dr. Tiburcio Pea.  Goal LDL less than      70.  4. Hypertension.  Blood pressure a bit elevated today.  The patient      tells me he checks it frequently at home, and this is at the top      range of what he has seen.  More commonly is in the 120 range.  We      will therefore make no changes in medication.   DISPOSITION:  We will see him back in  one year's time.     Salvadore Farber, MD  Electronically Signed    WED/MedQ  DD: 10/03/2006  DT: 10/04/2006  Job #: [id number]   cc:   Melida Quitter, M.D.

## 2011-03-12 NOTE — Consult Note (Signed)
[name], [name]                         ACCOUNT NO.:  [id number]   MEDICAL RECORD NO.:  [id number]                   PATIENT TYPE:  INP   LOCATION:  4740                                 FACILITY:  [facility]   PHYSICIAN:  James L. Malon Kindle., M.D.          DATE OF BIRTH:  June 30, 1916   DATE OF CONSULTATION:  12/12/2003  DATE OF DISCHARGE:                                   CONSULTATION   REASON FOR CONSULTATION:  Pancreatitis.   HISTORY:  This is an 77 year old gentleman who is a nondrinker, who was  feeling fine, in his usual state of health until recently.  He has been able  to eat anything he wants and has been able to eat fatty foods, etc., with no  weight loss, no abdominal pain, etc.  He had sudden onset three days ago of  severe epigastric pain and came to the emergency room.  He was found to have  pancreatitis.  His amylase was 1465.  Lipase was 793.  He subsequently came  down to near normal.  The patient is clinically feeling better.  He had a CT  scan done showing edema in the region of the pancreatic head with wall  thickening of the duodenum with no abscesses.  He was found to have  prostatomegaly and diverticulosis.  There was mild biliary duct dilatation.  The patient's liver tests were remarkable for total bilirubin of 4.5 that  has subsequently come down to 3, elevated alk phos and transaminases, both  of which are coming down.  The patient is a nondrinker.  He has not traveled  anywhere recently.  He notes that prior to this acute episode three days  ago, he was able to eat pretty much anything he wants without nausea or  pain.  Of note, two cardiac medicines were started.  He is not sure exactly  what they were.  One, he believes, was Norvasc.  There was another one, he  is not sure of the name of this one, which was started recently.  We were  asked to see him regarding etiology of his pancreatitis.  He had a  cholecystectomy approximately 20 years ago.   MEDICATIONS ON ADMISSION:  Lisinopril, _________, Norvasc, Toprol,  Pravachol, Plavix, aspirin and vitamins.   ALLERGIES:  No known drug allergies.   PAST MEDICAL HISTORY:  1. Apparently he has a good ejection fraction.  2. He has had a previous cholecystectomy.  3. He has coronary disease.  4. He had a coronary artery bypass graft in 1997.  5. Hypertension.  6. High cholesterol.   FAMILY HISTORY:  Noncontributory.   SOCIAL HISTORY:  He lives with his wife.  He denies alcohol.  He is unable  to drive due to glaucoma, not being able to see.   REVIEW OF SYMPTOMS:  Otherwise negative.   PHYSICAL EXAMINATION:  VITAL SIGNS:  The patient is afebrile and vital signs  are unremarkable.  GENERAL:  Nonicteric white male who is alert and oriented.  HEENT:  Extraocular movements are intact.  NECK:  Supple, lymphadenopathy.  LUNGS:  Clear.  HEART:  Regular rate and rhythm without murmurs or gallops.  ABDOMEN:  Soft, nondistended with mild epigastric tenderness.  Excellent  bowel sounds.   ASSESSMENT:  Acute pancreatitis - The patient is post cholecystectomy.  I  wonder if he passed a common duct stone.  This is suggestive with lack of  preceding symptoms, other than acute onset, as well as the elevated liver  tests, which have subsequently come back to normal.  I think that we will  need to clear his common duct.  There are no other obvious etiologies such  as trauma or alcohol to implicate.  The other possible causes are related to  Pravachol or possibly some other medication which were started recently,  which could have caused a drug induced pancreatitis, but it will be  difficult to sort this out, but he does seem to be getting better.   RECOMMENDATIONS:  We will go ahead and give him small amounts of clear  liquids following clinically.  I think that at some point and time he will  need an MRCP to look for stones in the bile duct, rule out any evidence of  pancreatic tumors,  etc.                                               Fayrene Fearing L. Malon Kindle., M.D.    Waldron Session  D:  12/12/2003  T:  12/13/2003  Job:  [id number]   cc:   Holley Bouche, M.D.  [address]  [place], [place] [address]  Fax: [phone]   Carole Binning, M.D. [facility]   Llana Aliment. Malon Kindle., M.D.  [address]  [place]  [place] [address]  Fax: [phone]   Graylin Shiver, M.D.  [address]  [address]  [place], [place] [address]  Fax: [phone]

## 2011-03-12 NOTE — Discharge Summary (Signed)
[name], [name]                         ACCOUNT NO.:  [id number]   MEDICAL RECORD NO.:  [id number]                   PATIENT TYPE:  INP   LOCATION:  4740                                 FACILITY:  [facility]   PHYSICIAN:  Jackie Plum, M.D.             DATE OF BIRTH:  August 07, 1916   DATE OF ADMISSION:  12/10/2003  DATE OF DISCHARGE:  12/18/2003                                 DISCHARGE SUMMARY   DISCHARGE DIAGNOSES:  1. Acute pancreatitis - resolved.     a. Abnormal liver function tests with AST of 258, ALT of 344, alkaline        phosphatase of 189, total bilirubin of 4.5, amylase of 1465, and        lipase of 793.     b. Discharge total bilirubin of 2.8, alkaline phosphatase 93, SGOT of 27,        SGPT of 77, total protein of 6.4, albumin of 3.2, amylase of 69,        lipase of 60 (these were drawn on December 13, 2003, outpatient follow-        up of his liver function tests by PCP recommended).     c. CT scan on admission dated December 11, 2003, of the abdomen and        pelvis notable for edema in the region of the pancreatic head and        uncinate process with apparent wall thickening of the duodenum at the        level of the ampulla. No evidence of pancreatic or common duct        obstruction.  Changes consistent with focal pancreatitis.  No discrete        mass apparent.  Marked prostatomegaly.  Small amount of free fluid.        Sigmoid diverticulosis.  Left inguinal hernia.     d. MRCP done on December 16, 2003, notable for stable changes of acute        pancreatitis, mild prominence of common bile that is post        cholecystectomy, no evidence of choledocholithiasis.  2. Diarrhea.     a. C.difficile toxin negative.  Presumed secondary to antibiotics which        are being discontinued on discharge.  Improved on discharge.  The        patient will be discharged home on Imodium p.r.n. as instructed.  The        patient not clinically dehydrated at time of  discharge and diarrhea is        actually improving.  3. Diastolic dysfunction likely secondary to coronary artery disease.     a. Two-dimensional echocardiogram done on December 13, 2003, notable for        normal overall left ventricular systolic function with estimated EF of        55 to 65%.  Mildly increased left  ventricular wall thickness.  Mild        focal basal septal hypertrophy.  Doppler primarily consistent with        abnormal left ventricular relaxation.  Mild left atrial enlargement.        Mild mitral valve regurgitation.  Study said to be inadequate for        evaluation of left ventricular regional wall motion abnormalities.     b. The patient started on low dose Lasix for presumptive diastolic heart        failure. He will need monitoring of his electrolyte and renal panel        during his outpatient follow-up with primary care physician and        cardiologist.  4. History of coronary artery disease, status post coronary artery bypass     graft.  5. History of elevated PSA with discharge PSA of 58.17, drawn on December 13, 2003.  The patient is to follow up with his urologist, [name], M.D. as an outpatient.  6. History of hypertension.  7. History of dyslipidemia.  8. Status post cholecystectomy.  9. Mild normocytic anemia with discharge hemoglobin of 12.0, presumed     secondary to acute hospitalization/illness, outpatient follow-up     recommended.   DISCHARGE MEDICATIONS:  The patient has the following new medications for  which prescriptions have been written.  1. Flagyl 500 mg p.o. t.i.d. for six days.  2. Lasix 20 mg p.o. daily.  3. Imodium 2 mg tablet, one tablet p.o. p.r.n. after each loose/watery stool     up to total of maximum of eight tablets a day. This has been stressed to     the patient.   The patient is going to continue his old medications for which he declines  any new prescriptions because he has adequate supply at home.   These  medicines include:  1. IMDUR.  2. Lisinopril.  3. Norvasc.  4. Toprol.  5. Pravachol.  6. Plavix.  7. Aspirin.  He will take these as previously.   DISCHARGE LABORATORY DATA:  WBC 5.7, hemoglobin 12.0, hematocrit 34.2, MCV  85.5, platelet count of 209.  Sodium 139, potassium 3.7, chloride 103, CO2  30, glucose 121, BUN 8, creatinine 1.3.  BNP 534.0, PSA 38.17.   ACTIVITY:  As tolerated.   DIET:  Cardiac diet.   DISCHARGE INSTRUCTIONS:  The patient has been instructed to report to M.D.  if he has any problems including, but not limited to fever, chills,  shortness of breath, increasing extremity swelling, worsening or persistence  of his diarrhea, abdominal pain.   HISTORY OF PRESENT ILLNESS:  The patient is an 77 year old Caucasian  gentleman who presented with sudden onset of worsening severe epigastric  pain of three days duration.  He has not been drinking alcohol and has not  traveled.  Worse only prior to admission.  He had not taken any over-the-  counter medications.  He had been recently started on some cardiac  medications that he was unsure of.  He did not have any history of fever,  chills, shortness of breath, chest pain, diarrhea, melena, or hematochezia.  He did not recall any of such symptoms at time of admission.   PHYSICAL EXAMINATION:  VITAL SIGNS:  According to admission H&P by Sherin Quarry, M.D., admission blood pressure was 215/73, heart rate of 82, O2  saturation of 94% on room air, temperature of 99.4  degrees Fahrenheit.  HEENT:  Within normal limits.  CHEST:  Clear to auscultation.  HEART:  Unremarkable.  ABDOMEN:  Notable for diffuse abdominal tenderness without any rebound.  NEUROLOGY:  EXTREMITIES:  Within normal limits per Dr. Landry Dyke H&P.   LABORATORY DATA:  The patient's chemistries indicated acute pancreatitis as  noted in subsection A of problem list #1.  He was therefore admitted for further evaluation and management.    HOSPITAL COURSE:  Problem 1.  Acute pancreatitis.  The patient was admitted  to the [facility]. He was started on a regimen of bowel rest with  IV fluid supplementation and analgesic for pain control.  He was admitted to  telemetry bed on account of his significant cardiac history and there were  no significant acute persistent dysrhythmias.  He was also started on IV  Niacin by the admitting doctor and Lovenox as well.  The patient received  ___________ measures as mentioned above including antiemetics with good  effect.  Blood cultures were drawn which did not grow any bacteria at the  time of discharge.  CT was done which confirmed this diagnosis.  In view of  his very high transaminases with high alkaline phosphatase, there were  persistent concerns for stone and Dr. Randa Evens of GI and Dr. Evette Cristal saw the  patient in consultation.  Dr. Randa Evens consultation note done on December 12, 2003, indicated that he agreed with diagnosis of acute pancreatitis.  However, in view of significant improvement of his liver function tests,  there were concerns about possibly passing stone.  He ___________ at the  time to clear his common duct of any such stones.  This was especially  important in view of lack of other obvious etiologies such as trauma,  alcohol, and medications to implicate the patient's symptoms.  He  recommended continue management plan as noted above and start the patient  slowly on small amounts of clear liquids.  He further recommended MRCP which  was done as noted above and was negative.   The patient has been symptoms-free in regard to his abdominal pain, nausea,  and it is believed that the patient possibly passed a stone, this being  etiologic factor for his pancreatitis.  At the time of discharge, the  patient was able to tolerate food without any problems.  He is without any  significant GI symptoms except for diarrhea as noted above which is  improving and thought to  be related to antibiotics.  Plan for follow-up and  management of diarrhea is as noted in problem list as above.   Problem 2.  Heart failure with diastolic dysfunction.  During this  hospitalization, the patient had an acute episode of shortness of breath.  X-  ray at the time indicated edema with some effusion.  He had a BNP which was  elevated at 534.  Cardiac enzymes were drawn and were negative for any  myocardial infarction.  There were no significant acute EKG changes.  Follow-  up two-dimensional echocardiogram showed diastolic dysfunction as mentioned  above.  The patient is therefore started on low dose Lasix for further  management of the above.  At the time of discharge, the patient does not  have any pulmonary symptomatology and in fact his O2 saturations on room air  were 93%.   Problem 3.  Elevated PSA.  The patient had a PSA checked by the admitting team on account of prostatomegaly.  On CT and it came out to be 38.17.  On  further discussion with the patient he said that he has always had high  PSA's and this is not new.  He therefore, is being discharged home to follow  up with his urologist, [name] as mentioned above.   I spent a good amount of time to discuss the patient's workup so far, all  laboratory results, and impressions with the patient.  I also discussed new  medications that had been added to the patient's medication regimen and the  rationale behind these.  I also went ahead and called the patient's  daughter, [name], who was at this time out of town.  She was called on  her cell phone, number [phone].  Further reiterated all workup that was  done here, our impressions, and plans for further outpatient care.  I also  discussed with her all of the new medication regimen that has been added to  medicines.  She had opportunity to ask questions and all of her questions  were appropriately answered.  The patient is going to be going home with  his  son-in-law who has asked the nurses to go through discharge instructions as  well with him on arrival to the hospital.  It was a pleasure taking care of  the patient.  He is discharged home in stable and satisfactory condition.   FOLLOW UP:  With his primary care physician, Dr. Tiburcio Pea, on December 25, 2003,  at 4 p.m. which is Wednesday.  He will also follow up with [name],  his urologist, on December 30, 2003, at 9:45 a.m.                                                Jackie Plum, M.D.    GO/MEDQ  D:  12/18/2003  T:  12/18/2003  Job:  [id number]   cc:   Fayrene Fearing L. Malon Kindle., M.D.  [address]  [place]  [place] [address]  Fax: [phone]   Juluis Mire, M.D.  [address]  [place]  [place] [address]  Fax: [phone]   Carole Binning, M.D. [facility]   Graylin Shiver, M.D.  [address]  [address]  [place], [place] [address]  Fax: [phone]

## 2011-03-12 NOTE — Op Note (Signed)
   NAMENORTH, ESTERLINE                         ACCOUNT NO.:  [id number]   MEDICAL RECORD NO.:  [id number]                   PATIENT TYPE:  AMB   LOCATION:  ENDO                                 [facility]ACILITY[facility]   PHYSICIAN:  Graylin Shiver, M.D.                DATE OF BIRTH:  June 22, 1916   DATE OF PROCEDURE:  05/23/2003  DATE OF DISCHARGE:                                 OPERATIVE REPORT   PROCEDURE:  Colonoscopy.   INDICATIONS FOR PROCEDURE:  History of colon polyps, family history of colon  cancer.  Informed consent was obtained after explanation of the risks of  bleeding, infection, and perforation.   PREMEDICATIONS:  1. Fentanyl 40 mcg IV.  2. Versed 3 mg IV.   DESCRIPTION OF PROCEDURE:  With the patient in the left lateral decubitus  position, a rectal exam was performed, and no masses were felt.  The Olympus  colonoscope was inserted into the rectum and advanced around the colon to  the cecum.  Cecal landmarks were identified.  The cecum and ascending colon  were normal.  The transverse colon was normal.  The descending colon and the  sigmoid revealed diverticulosis which were most extensive in the sigmoid.  The rectum was normal.  He tolerated the procedure well without  complications.   IMPRESSION:  1. Diverticulosis of left colon.  2. No evidence of __________ polyps.                                               Graylin Shiver, M.D.    SFG/MEDQ  D:  05/23/2003  T:  05/23/2003  Job:  [id number]   cc:   Holley Bouche, M.D.  [address]  [place], [place] [address]  Fax: [phone]

## 2011-03-29 ENCOUNTER — Encounter (HOSPITAL_BASED_OUTPATIENT_CLINIC_OR_DEPARTMENT_OTHER): Payer: Medicare Other | Admitting: Oncology

## 2011-03-29 ENCOUNTER — Other Ambulatory Visit: Payer: Self-pay | Admitting: Oncology

## 2011-03-29 DIAGNOSIS — D649 Anemia, unspecified: Secondary | ICD-10-CM

## 2011-03-29 DIAGNOSIS — D696 Thrombocytopenia, unspecified: Secondary | ICD-10-CM

## 2011-03-29 LAB — CBC WITH DIFFERENTIAL/PLATELET
EOS%: 1 % (ref 0.0–7.0)
Eosinophils Absolute: 0 10*3/uL (ref 0.0–0.5)
LYMPH%: 29.8 % (ref 14.0–49.0)
MCH: 32.8 pg (ref 27.2–33.4)
MCV: 93.3 fL (ref 79.3–98.0)
MONO%: 2.8 % (ref 0.0–14.0)
NEUT#: 1.8 10*3/uL (ref 1.5–6.5)
Platelets: 65 10*3/uL — ABNORMAL LOW (ref 140–400)
RBC: 3.05 10*6/uL — ABNORMAL LOW (ref 4.20–5.82)
RDW: 15.3 % — ABNORMAL HIGH (ref 11.0–14.6)

## 2011-04-19 ENCOUNTER — Encounter (HOSPITAL_BASED_OUTPATIENT_CLINIC_OR_DEPARTMENT_OTHER): Payer: Medicare Other | Admitting: Oncology

## 2011-04-19 ENCOUNTER — Other Ambulatory Visit: Payer: Self-pay | Admitting: Oncology

## 2011-04-19 DIAGNOSIS — D696 Thrombocytopenia, unspecified: Secondary | ICD-10-CM

## 2011-04-19 LAB — CBC WITH DIFFERENTIAL/PLATELET
BASO%: 0.4 % (ref 0.0–2.0)
LYMPH%: 29.2 % (ref 14.0–49.0)
MCHC: 33 g/dL (ref 32.0–36.0)
MCV: 94.7 fL (ref 79.3–98.0)
MONO#: 0.1 10*3/uL (ref 0.1–0.9)
MONO%: 4.4 % (ref 0.0–14.0)
NEUT#: 1.8 10*3/uL (ref 1.5–6.5)
Platelets: 62 10*3/uL — ABNORMAL LOW (ref 140–400)
RBC: 3.61 10*6/uL — ABNORMAL LOW (ref 4.20–5.82)
RDW: 15.9 % — ABNORMAL HIGH (ref 11.0–14.6)
WBC: 2.7 10*3/uL — ABNORMAL LOW (ref 4.0–10.3)

## 2011-04-30 ENCOUNTER — Other Ambulatory Visit: Payer: Self-pay | Admitting: Cardiology

## 2011-05-11 ENCOUNTER — Other Ambulatory Visit: Payer: Self-pay | Admitting: Medical

## 2011-05-11 ENCOUNTER — Encounter (HOSPITAL_BASED_OUTPATIENT_CLINIC_OR_DEPARTMENT_OTHER): Payer: Medicare Other | Admitting: Oncology

## 2011-05-11 DIAGNOSIS — N289 Disorder of kidney and ureter, unspecified: Secondary | ICD-10-CM

## 2011-05-11 DIAGNOSIS — D649 Anemia, unspecified: Secondary | ICD-10-CM

## 2011-05-11 DIAGNOSIS — D638 Anemia in other chronic diseases classified elsewhere: Secondary | ICD-10-CM

## 2011-05-11 DIAGNOSIS — D696 Thrombocytopenia, unspecified: Secondary | ICD-10-CM

## 2011-05-11 LAB — CBC WITH DIFFERENTIAL/PLATELET
BASO%: 0.2 % (ref 0.0–2.0)
Eosinophils Absolute: 0 10*3/uL (ref 0.0–0.5)
MCHC: 35 g/dL (ref 32.0–36.0)
MONO#: 0.1 10*3/uL (ref 0.1–0.9)
NEUT#: 1.7 10*3/uL (ref 1.5–6.5)
RBC: 3.16 10*6/uL — ABNORMAL LOW (ref 4.20–5.82)
WBC: 2.8 10*3/uL — ABNORMAL LOW (ref 4.0–10.3)
lymph#: 1 10*3/uL (ref 0.9–3.3)

## 2011-06-02 ENCOUNTER — Encounter (HOSPITAL_BASED_OUTPATIENT_CLINIC_OR_DEPARTMENT_OTHER): Payer: Medicare Other | Admitting: Oncology

## 2011-06-02 ENCOUNTER — Other Ambulatory Visit: Payer: Self-pay | Admitting: Oncology

## 2011-06-02 DIAGNOSIS — D696 Thrombocytopenia, unspecified: Secondary | ICD-10-CM

## 2011-06-02 DIAGNOSIS — D649 Anemia, unspecified: Secondary | ICD-10-CM

## 2011-06-02 DIAGNOSIS — N289 Disorder of kidney and ureter, unspecified: Secondary | ICD-10-CM

## 2011-06-02 LAB — CBC WITH DIFFERENTIAL/PLATELET
BASO%: 0.3 % (ref 0.0–2.0)
Basophils Absolute: 0 10*3/uL (ref 0.0–0.1)
Eosinophils Absolute: 0 10*3/uL (ref 0.0–0.5)
HCT: 31.1 % — ABNORMAL LOW (ref 38.4–49.9)
HGB: 10.8 g/dL — ABNORMAL LOW (ref 13.0–17.1)
MCHC: 34.5 g/dL (ref 32.0–36.0)
MONO#: 0.1 10*3/uL (ref 0.1–0.9)
NEUT#: 1.4 10*3/uL — ABNORMAL LOW (ref 1.5–6.5)
NEUT%: 67.4 % (ref 39.0–75.0)
WBC: 2.1 10*3/uL — ABNORMAL LOW (ref 4.0–10.3)
lymph#: 0.6 10*3/uL — ABNORMAL LOW (ref 0.9–3.3)

## 2011-06-15 ENCOUNTER — Encounter: Payer: Self-pay | Admitting: Cardiology

## 2011-07-12 ENCOUNTER — Other Ambulatory Visit: Payer: Self-pay | Admitting: Oncology

## 2011-07-12 ENCOUNTER — Encounter (HOSPITAL_BASED_OUTPATIENT_CLINIC_OR_DEPARTMENT_OTHER): Payer: Medicare Other | Admitting: Oncology

## 2011-07-12 DIAGNOSIS — D696 Thrombocytopenia, unspecified: Secondary | ICD-10-CM

## 2011-07-12 DIAGNOSIS — N289 Disorder of kidney and ureter, unspecified: Secondary | ICD-10-CM

## 2011-07-12 DIAGNOSIS — D649 Anemia, unspecified: Secondary | ICD-10-CM

## 2011-07-12 LAB — CBC WITH DIFFERENTIAL/PLATELET
BASO%: 0.3 % (ref 0.0–2.0)
EOS%: 0.5 % (ref 0.0–7.0)
MCH: 33 pg (ref 27.2–33.4)
MCHC: 34.5 g/dL (ref 32.0–36.0)
MONO#: 0.1 10*3/uL (ref 0.1–0.9)
NEUT%: 67.2 % (ref 39.0–75.0)
RBC: 3.23 10*6/uL — ABNORMAL LOW (ref 4.20–5.82)
RDW: 15.8 % — ABNORMAL HIGH (ref 11.0–14.6)
WBC: 2.7 10*3/uL — ABNORMAL LOW (ref 4.0–10.3)
lymph#: 0.8 10*3/uL — ABNORMAL LOW (ref 0.9–3.3)

## 2011-08-03 ENCOUNTER — Other Ambulatory Visit: Payer: Self-pay | Admitting: Oncology

## 2011-08-03 ENCOUNTER — Encounter (HOSPITAL_BASED_OUTPATIENT_CLINIC_OR_DEPARTMENT_OTHER): Payer: Medicare Other | Admitting: Oncology

## 2011-08-03 DIAGNOSIS — D649 Anemia, unspecified: Secondary | ICD-10-CM

## 2011-08-03 DIAGNOSIS — D696 Thrombocytopenia, unspecified: Secondary | ICD-10-CM

## 2011-08-03 DIAGNOSIS — D61818 Other pancytopenia: Secondary | ICD-10-CM

## 2011-08-03 LAB — CBC WITH DIFFERENTIAL/PLATELET
Basophils Absolute: 0 10*3/uL (ref 0.0–0.1)
EOS%: 0.8 % (ref 0.0–7.0)
HGB: 11.1 g/dL — ABNORMAL LOW (ref 13.0–17.1)
MCH: 33.4 pg (ref 27.2–33.4)
MONO#: 0.1 10*3/uL (ref 0.1–0.9)
NEUT#: 1.6 10*3/uL (ref 1.5–6.5)
RDW: 16.8 % — ABNORMAL HIGH (ref 11.0–14.6)
WBC: 2.5 10*3/uL — ABNORMAL LOW (ref 4.0–10.3)
lymph#: 0.8 10*3/uL — ABNORMAL LOW (ref 0.9–3.3)

## 2011-08-17 ENCOUNTER — Other Ambulatory Visit: Payer: Self-pay | Admitting: Oncology

## 2011-09-02 ENCOUNTER — Other Ambulatory Visit: Payer: Self-pay | Admitting: Oncology

## 2011-09-03 ENCOUNTER — Other Ambulatory Visit: Payer: Self-pay | Admitting: Oncology

## 2011-09-03 ENCOUNTER — Ambulatory Visit (HOSPITAL_BASED_OUTPATIENT_CLINIC_OR_DEPARTMENT_OTHER): Payer: Medicare Other

## 2011-09-03 ENCOUNTER — Other Ambulatory Visit (HOSPITAL_BASED_OUTPATIENT_CLINIC_OR_DEPARTMENT_OTHER): Payer: Medicare Other | Admitting: Lab

## 2011-09-03 VITALS — BP 136/54 | HR 70 | Temp 96.9°F

## 2011-09-03 DIAGNOSIS — N259 Disorder resulting from impaired renal tubular function, unspecified: Secondary | ICD-10-CM

## 2011-09-03 DIAGNOSIS — D649 Anemia, unspecified: Secondary | ICD-10-CM

## 2011-09-03 DIAGNOSIS — D696 Thrombocytopenia, unspecified: Secondary | ICD-10-CM

## 2011-09-03 LAB — CBC WITH DIFFERENTIAL/PLATELET
Basophils Absolute: 0 10*3/uL (ref 0.0–0.1)
Eosinophils Absolute: 0 10*3/uL (ref 0.0–0.5)
HCT: 27.2 % — ABNORMAL LOW (ref 38.4–49.9)
HGB: 9.3 g/dL — ABNORMAL LOW (ref 13.0–17.1)
MCV: 96.3 fL (ref 79.3–98.0)
MONO%: 1.7 % (ref 0.0–14.0)
NEUT#: 3.4 10*3/uL (ref 1.5–6.5)
NEUT%: 80.1 % — ABNORMAL HIGH (ref 39.0–75.0)
RDW: 15.7 % — ABNORMAL HIGH (ref 11.0–14.6)
lymph#: 0.7 10*3/uL — ABNORMAL LOW (ref 0.9–3.3)

## 2011-09-03 MED ORDER — DARBEPOETIN ALFA-POLYSORBATE 500 MCG/ML IJ SOLN
300.0000 ug | Freq: Once | INTRAMUSCULAR | Status: AC
  Administered 2011-09-03: 300 ug via SUBCUTANEOUS
  Filled 2011-09-03: qty 1

## 2011-09-06 ENCOUNTER — Other Ambulatory Visit: Payer: Self-pay | Admitting: Cardiology

## 2011-09-06 DIAGNOSIS — I6529 Occlusion and stenosis of unspecified carotid artery: Secondary | ICD-10-CM

## 2011-09-07 ENCOUNTER — Encounter (INDEPENDENT_AMBULATORY_CARE_PROVIDER_SITE_OTHER): Payer: Medicare Other | Admitting: *Deleted

## 2011-09-07 DIAGNOSIS — I6529 Occlusion and stenosis of unspecified carotid artery: Secondary | ICD-10-CM

## 2011-09-23 ENCOUNTER — Telehealth: Payer: Self-pay | Admitting: *Deleted

## 2011-09-23 NOTE — Telephone Encounter (Signed)
left patient message to come by the scheduling department to pick up the new dates and time on 10-01-2011

## 2011-10-01 ENCOUNTER — Other Ambulatory Visit (HOSPITAL_BASED_OUTPATIENT_CLINIC_OR_DEPARTMENT_OTHER): Payer: Medicare Other | Admitting: Lab

## 2011-10-01 ENCOUNTER — Ambulatory Visit (HOSPITAL_BASED_OUTPATIENT_CLINIC_OR_DEPARTMENT_OTHER): Payer: Medicare Other

## 2011-10-01 VITALS — BP 164/59 | HR 67 | Temp 96.7°F

## 2011-10-01 DIAGNOSIS — N289 Disorder of kidney and ureter, unspecified: Secondary | ICD-10-CM

## 2011-10-01 DIAGNOSIS — D649 Anemia, unspecified: Secondary | ICD-10-CM

## 2011-10-01 DIAGNOSIS — N259 Disorder resulting from impaired renal tubular function, unspecified: Secondary | ICD-10-CM

## 2011-10-01 LAB — CBC WITH DIFFERENTIAL/PLATELET
Basophils Absolute: 0 10*3/uL (ref 0.0–0.1)
Eosinophils Absolute: 0 10*3/uL (ref 0.0–0.5)
HCT: 29.2 % — ABNORMAL LOW (ref 38.4–49.9)
HGB: 10.1 g/dL — ABNORMAL LOW (ref 13.0–17.1)
MONO#: 0.1 10*3/uL (ref 0.1–0.9)
NEUT#: 1.5 10*3/uL (ref 1.5–6.5)
NEUT%: 61.1 % (ref 39.0–75.0)
WBC: 2.5 10*3/uL — ABNORMAL LOW (ref 4.0–10.3)
lymph#: 0.9 10*3/uL (ref 0.9–3.3)

## 2011-10-01 MED ORDER — DARBEPOETIN ALFA-POLYSORBATE 500 MCG/ML IJ SOLN
300.0000 ug | Freq: Once | INTRAMUSCULAR | Status: AC
  Administered 2011-10-01: 300 ug via SUBCUTANEOUS
  Filled 2011-10-01: qty 1

## 2011-10-29 ENCOUNTER — Other Ambulatory Visit: Payer: Medicare Other | Admitting: Lab

## 2011-10-29 ENCOUNTER — Other Ambulatory Visit: Payer: Self-pay | Admitting: Oncology

## 2011-10-29 ENCOUNTER — Ambulatory Visit (HOSPITAL_BASED_OUTPATIENT_CLINIC_OR_DEPARTMENT_OTHER): Payer: Medicare Other

## 2011-10-29 VITALS — BP 150/62 | HR 73 | Temp 97.2°F

## 2011-10-29 DIAGNOSIS — N259 Disorder resulting from impaired renal tubular function, unspecified: Secondary | ICD-10-CM

## 2011-10-29 DIAGNOSIS — D696 Thrombocytopenia, unspecified: Secondary | ICD-10-CM

## 2011-10-29 LAB — CBC WITH DIFFERENTIAL/PLATELET
Basophils Absolute: 0 10*3/uL (ref 0.0–0.1)
EOS%: 0.8 % (ref 0.0–7.0)
Eosinophils Absolute: 0 10*3/uL (ref 0.0–0.5)
HCT: 30.5 % — ABNORMAL LOW (ref 38.4–49.9)
HGB: 10.6 g/dL — ABNORMAL LOW (ref 13.0–17.1)
MCH: 33.8 pg — ABNORMAL HIGH (ref 27.2–33.4)
MCV: 97.8 fL (ref 79.3–98.0)
MONO%: 1.8 % (ref 0.0–14.0)
NEUT%: 72.5 % (ref 39.0–75.0)

## 2011-10-29 MED ORDER — DARBEPOETIN ALFA-POLYSORBATE 500 MCG/ML IJ SOLN
300.0000 ug | Freq: Once | INTRAMUSCULAR | Status: AC
  Administered 2011-10-29: 300 ug via SUBCUTANEOUS
  Filled 2011-10-29: qty 1

## 2011-11-26 ENCOUNTER — Telehealth: Payer: Self-pay | Admitting: Oncology

## 2011-11-26 ENCOUNTER — Ambulatory Visit (HOSPITAL_BASED_OUTPATIENT_CLINIC_OR_DEPARTMENT_OTHER): Payer: Medicare Other | Admitting: Oncology

## 2011-11-26 ENCOUNTER — Other Ambulatory Visit (HOSPITAL_BASED_OUTPATIENT_CLINIC_OR_DEPARTMENT_OTHER): Payer: Medicare Other | Admitting: Lab

## 2011-11-26 VITALS — BP 173/60 | HR 55 | Temp 96.9°F | Ht 67.0 in | Wt 157.4 lb

## 2011-11-26 DIAGNOSIS — R5381 Other malaise: Secondary | ICD-10-CM

## 2011-11-26 DIAGNOSIS — D696 Thrombocytopenia, unspecified: Secondary | ICD-10-CM

## 2011-11-26 DIAGNOSIS — R079 Chest pain, unspecified: Secondary | ICD-10-CM

## 2011-11-26 DIAGNOSIS — D649 Anemia, unspecified: Secondary | ICD-10-CM

## 2011-11-26 DIAGNOSIS — D6949 Other primary thrombocytopenia: Secondary | ICD-10-CM

## 2011-11-26 DIAGNOSIS — D638 Anemia in other chronic diseases classified elsewhere: Secondary | ICD-10-CM

## 2011-11-26 DIAGNOSIS — N189 Chronic kidney disease, unspecified: Secondary | ICD-10-CM

## 2011-11-26 DIAGNOSIS — N259 Disorder resulting from impaired renal tubular function, unspecified: Secondary | ICD-10-CM

## 2011-11-26 DIAGNOSIS — I6529 Occlusion and stenosis of unspecified carotid artery: Secondary | ICD-10-CM

## 2011-11-26 DIAGNOSIS — I701 Atherosclerosis of renal artery: Secondary | ICD-10-CM

## 2011-11-26 DIAGNOSIS — I509 Heart failure, unspecified: Secondary | ICD-10-CM

## 2011-11-26 DIAGNOSIS — I10 Essential (primary) hypertension: Secondary | ICD-10-CM

## 2011-11-26 DIAGNOSIS — E7800 Pure hypercholesterolemia, unspecified: Secondary | ICD-10-CM

## 2011-11-26 DIAGNOSIS — I2510 Atherosclerotic heart disease of native coronary artery without angina pectoris: Secondary | ICD-10-CM

## 2011-11-26 DIAGNOSIS — R0602 Shortness of breath: Secondary | ICD-10-CM

## 2011-11-26 LAB — CBC WITH DIFFERENTIAL/PLATELET
BASO%: 0.4 % (ref 0.0–2.0)
EOS%: 0.4 % (ref 0.0–7.0)
Eosinophils Absolute: 0 10*3/uL (ref 0.0–0.5)
MCV: 97.8 fL (ref 79.3–98.0)
MONO%: 3.6 % (ref 0.0–14.0)
NEUT#: 1.8 10*3/uL (ref 1.5–6.5)
RBC: 3.17 10*6/uL — ABNORMAL LOW (ref 4.20–5.82)
RDW: 15.9 % — ABNORMAL HIGH (ref 11.0–14.6)
nRBC: 0 % (ref 0–0)

## 2011-11-26 LAB — TECHNOLOGIST REVIEW: Technologist Review: 1

## 2011-11-26 MED ORDER — DARBEPOETIN ALFA-POLYSORBATE 300 MCG/0.6ML IJ SOLN
300.0000 ug | Freq: Once | INTRAMUSCULAR | Status: AC
  Administered 2011-11-26: 300 ug via SUBCUTANEOUS
  Filled 2011-11-26: qty 0.6

## 2011-11-26 NOTE — Telephone Encounter (Signed)
Gv pt appt for march-may2013 

## 2011-11-26 NOTE — Progress Notes (Signed)
Hematology and Oncology Follow Up Visit  [name] [id number] 06-Aug-1916 78 y.o. 11/26/2011 9:11 AM  CC: [name]. Jens Som, MD, [facility]  Melida Quitter, M.D.    Principle Diagnosis:  A 78 year old a gentleman with a diagnosis of a multifactorial anemia, it is anemia of renal disease, possible early myelodysplasia.    Current therapy: He is on Aranesp 300 mcg every 3-4 weeks to keep his hemoglobin above 11.  Interim History:  [name] presents today for a followup visit.  It has continued to tolerate Aranesp fairly well without any major complications or toxicity.  He had not had any infusion or injection-related problems.  He had not had any pain or discomfort, his performance activity level, although limited, but really unchanged.  He does report some occasional fatigue and tiredness.  He does report some occasional exertional dyspnea but really no shortness of breath or chest pain associated with that at this time. He did not have any illnesses or any hospitalizations.   Medications: I have reviewed the patient's current medications. Current outpatient prescriptions:aspirin 81 MG tablet, Take 81 mg by mouth daily.  , Disp: , Rfl: ;  dorzolamide-timolol (COSOPT) 22.3-6.8 MG/ML ophthalmic solution, Place 1 drop into both eyes 2 (two) times daily.  , Disp: , Rfl: ;  esomeprazole (NEXIUM) 40 MG capsule, Take 40 mg by mouth daily.  , Disp: , Rfl: ;  felodipine (PLENDIL) 10 MG 24 hr tablet, Take 10 mg by mouth daily.  , Disp: , Rfl:  folic acid (FOLVITE) 400 MCG tablet, Take 400 mcg by mouth daily.  , Disp: , Rfl: ;  isosorbide mononitrate (IMDUR) 60 MG 24 hr tablet, Take 60 mg by mouth daily.  , Disp: , Rfl: ;  MICARDIS HCT 40-12.5 MG per tablet, TAKE 2 TABLETS BY MOUTH ONCE DAILY, Disp: 60 tablet, Rfl: 6;  Multiple Vitamin (MULTIVITAMIN) tablet, Take 1 tablet by mouth daily.  , Disp: , Rfl: ;  pravastatin (PRAVACHOL) 40 MG tablet, Take 40 mg by mouth daily.  , Disp: , Rfl:  terazosin  (HYTRIN) 10 MG capsule, Take 10 mg by mouth at bedtime.  , Disp: , Rfl:  Current facility-administered medications:darbepoetin (ARANESP) injection 300 mcg, 300 mcg, Subcutaneous, Once, Eli Hose, MD  Allergies: Allergies no known allergies  Past Medical History, Surgical history, Social history, and Family History were reviewed and updated.  Review of Systems: Constitutional:  Negative for fever, chills, night sweats, anorexia, weight loss, pain. Cardiovascular: no chest pain or dyspnea on exertion Respiratory: no cough, shortness of breath, or wheezing Neurological: no TIA or stroke symptoms Dermatological: negative ENT: negative Skin: Negative. Gastrointestinal: no abdominal pain, change in bowel habits, or black or bloody stools Genito-Urinary: no dysuria, trouble voiding, or hematuria Hematological and Lymphatic: negative Breast: negative Musculoskeletal: negative Remaining ROS negative. Physical Exam: Blood pressure 173/60, pulse 55, temperature 96.9 F (36.1 C), temperature source Oral, height 5\' 7"  (1.702 m), weight 157 lb 6.4 oz (71.396 kg). ECOG: 2 General appearance: alert Head: Normocephalic, without obvious abnormality, atraumatic Neck: no adenopathy, no carotid bruit, no JVD, supple, symmetrical, trachea midline and thyroid not enlarged, symmetric, no tenderness/mass/nodules Lymph nodes: Cervical, supraclavicular, and axillary nodes normal. Heart:regular rate and rhythm, S1, S2 normal, no murmur, click, rub or gallop Lung:chest clear, no wheezing, rales, normal symmetric air entry Abdomin: soft, non-tender, without masses or organomegaly EXT:no erythema, induration, or nodules   Lab Results: Lab Results  Component Value Date   WBC 2.7* 11/26/2011   HGB 10.1*  11/26/2011   HCT 31.0* 11/26/2011   MCV 97.8 11/26/2011   PLT 58* 11/26/2011     Chemistry      Component Value Date/Time   NA 139 01/19/2011 1533   K 4.7 01/19/2011 1533   CL 106 01/19/2011 1533   CO2 25  01/19/2011 1533   BUN 34* 01/19/2011 1533   CREATININE 1.82* 01/19/2011 1533      Component Value Date/Time   CALCIUM 9.2 01/19/2011 1533   ALKPHOS 65 01/19/2011 1533   AST 16 01/19/2011 1533   ALT 10 01/19/2011 1533   BILITOT 1.4* 01/19/2011 1533       Impression and Plan:  A 78 year old gentleman with the following issues: 1. Anemia is multifactorial in nature.  He could have an element of early myelodysplasia.  Continue Aranesp every 3-4 weeks to keep his hemoglobin above 11. 2. Thrombocytopenia.  It is mild at this time, no bleeding noted. Continue to follow for the time being.  [facility], MD 2/1/20139:11 AM

## 2011-12-01 ENCOUNTER — Other Ambulatory Visit: Payer: Self-pay | Admitting: Cardiology

## 2011-12-24 ENCOUNTER — Other Ambulatory Visit: Payer: Medicare Other | Admitting: Lab

## 2011-12-24 ENCOUNTER — Ambulatory Visit (HOSPITAL_BASED_OUTPATIENT_CLINIC_OR_DEPARTMENT_OTHER): Payer: Medicare Other

## 2011-12-24 DIAGNOSIS — N259 Disorder resulting from impaired renal tubular function, unspecified: Secondary | ICD-10-CM

## 2011-12-24 DIAGNOSIS — I701 Atherosclerosis of renal artery: Secondary | ICD-10-CM

## 2011-12-24 DIAGNOSIS — I6529 Occlusion and stenosis of unspecified carotid artery: Secondary | ICD-10-CM

## 2011-12-24 DIAGNOSIS — I2510 Atherosclerotic heart disease of native coronary artery without angina pectoris: Secondary | ICD-10-CM

## 2011-12-24 DIAGNOSIS — D649 Anemia, unspecified: Secondary | ICD-10-CM

## 2011-12-24 DIAGNOSIS — I10 Essential (primary) hypertension: Secondary | ICD-10-CM

## 2011-12-24 DIAGNOSIS — R079 Chest pain, unspecified: Secondary | ICD-10-CM

## 2011-12-24 DIAGNOSIS — R0602 Shortness of breath: Secondary | ICD-10-CM

## 2011-12-24 DIAGNOSIS — E7800 Pure hypercholesterolemia, unspecified: Secondary | ICD-10-CM

## 2011-12-24 DIAGNOSIS — I509 Heart failure, unspecified: Secondary | ICD-10-CM

## 2011-12-24 DIAGNOSIS — R5381 Other malaise: Secondary | ICD-10-CM

## 2011-12-24 DIAGNOSIS — D696 Thrombocytopenia, unspecified: Secondary | ICD-10-CM

## 2011-12-24 LAB — CBC WITH DIFFERENTIAL/PLATELET
Basophils Absolute: 0 10*3/uL (ref 0.0–0.1)
Eosinophils Absolute: 0 10*3/uL (ref 0.0–0.5)
HCT: 28.6 % — ABNORMAL LOW (ref 38.4–49.9)
HGB: 9.8 g/dL — ABNORMAL LOW (ref 13.0–17.1)
MCH: 32.9 pg (ref 27.2–33.4)
MONO#: 0 10*3/uL — ABNORMAL LOW (ref 0.1–0.9)
NEUT#: 1.4 10*3/uL — ABNORMAL LOW (ref 1.5–6.5)
NEUT%: 63.9 % (ref 39.0–75.0)
RDW: 16.6 % — ABNORMAL HIGH (ref 11.0–14.6)
lymph#: 0.7 10*3/uL — ABNORMAL LOW (ref 0.9–3.3)

## 2011-12-24 MED ORDER — DARBEPOETIN ALFA-POLYSORBATE 300 MCG/0.6ML IJ SOLN
300.0000 ug | Freq: Once | INTRAMUSCULAR | Status: AC
  Administered 2011-12-24: 300 ug via SUBCUTANEOUS
  Filled 2011-12-24: qty 0.6

## 2011-12-29 ENCOUNTER — Other Ambulatory Visit (HOSPITAL_BASED_OUTPATIENT_CLINIC_OR_DEPARTMENT_OTHER): Payer: Medicare Other | Admitting: Lab

## 2011-12-29 ENCOUNTER — Telehealth: Payer: Self-pay | Admitting: Oncology

## 2011-12-29 ENCOUNTER — Ambulatory Visit (HOSPITAL_BASED_OUTPATIENT_CLINIC_OR_DEPARTMENT_OTHER): Payer: Medicare Other | Admitting: Oncology

## 2011-12-29 VITALS — BP 142/58 | HR 58 | Temp 96.7°F | Ht 67.0 in | Wt 155.9 lb

## 2011-12-29 DIAGNOSIS — D696 Thrombocytopenia, unspecified: Secondary | ICD-10-CM

## 2011-12-29 DIAGNOSIS — E7800 Pure hypercholesterolemia, unspecified: Secondary | ICD-10-CM

## 2011-12-29 DIAGNOSIS — I10 Essential (primary) hypertension: Secondary | ICD-10-CM

## 2011-12-29 DIAGNOSIS — I2510 Atherosclerotic heart disease of native coronary artery without angina pectoris: Secondary | ICD-10-CM

## 2011-12-29 DIAGNOSIS — N289 Disorder of kidney and ureter, unspecified: Secondary | ICD-10-CM

## 2011-12-29 DIAGNOSIS — I6529 Occlusion and stenosis of unspecified carotid artery: Secondary | ICD-10-CM

## 2011-12-29 DIAGNOSIS — I509 Heart failure, unspecified: Secondary | ICD-10-CM

## 2011-12-29 DIAGNOSIS — D649 Anemia, unspecified: Secondary | ICD-10-CM

## 2011-12-29 DIAGNOSIS — R079 Chest pain, unspecified: Secondary | ICD-10-CM

## 2011-12-29 DIAGNOSIS — I701 Atherosclerosis of renal artery: Secondary | ICD-10-CM

## 2011-12-29 DIAGNOSIS — R5381 Other malaise: Secondary | ICD-10-CM

## 2011-12-29 DIAGNOSIS — R0602 Shortness of breath: Secondary | ICD-10-CM

## 2011-12-29 DIAGNOSIS — N259 Disorder resulting from impaired renal tubular function, unspecified: Secondary | ICD-10-CM

## 2011-12-29 DIAGNOSIS — D469 Myelodysplastic syndrome, unspecified: Secondary | ICD-10-CM

## 2011-12-29 LAB — CBC WITH DIFFERENTIAL/PLATELET
BASO%: 0.3 % (ref 0.0–2.0)
Basophils Absolute: 0 10e3/uL (ref 0.0–0.1)
EOS%: 0.4 % (ref 0.0–7.0)
Eosinophils Absolute: 0 10e3/uL (ref 0.0–0.5)
HCT: 27.9 % — ABNORMAL LOW (ref 38.4–49.9)
HGB: 9.5 g/dL — ABNORMAL LOW (ref 13.0–17.1)
LYMPH%: 23.9 % (ref 14.0–49.0)
MCH: 33.1 pg (ref 27.2–33.4)
MCHC: 34 g/dL (ref 32.0–36.0)
MCV: 97.3 fL (ref 79.3–98.0)
MONO#: 0.1 10e3/uL (ref 0.1–0.9)
MONO%: 1.7 % (ref 0.0–14.0)
NEUT#: 2.4 10e3/uL (ref 1.5–6.5)
NEUT%: 73.7 % (ref 39.0–75.0)
Platelets: 86 10e3/uL — ABNORMAL LOW (ref 140–400)
RBC: 2.87 10e6/uL — ABNORMAL LOW (ref 4.20–5.82)
RDW: 17.2 % — ABNORMAL HIGH (ref 11.0–14.6)
WBC: 3.2 10e3/uL — ABNORMAL LOW (ref 4.0–10.3)
lymph#: 0.8 10e3/uL — ABNORMAL LOW (ref 0.9–3.3)

## 2011-12-29 LAB — COMPREHENSIVE METABOLIC PANEL
Alkaline Phosphatase: 71 U/L (ref 39–117)
BUN: 41 mg/dL — ABNORMAL HIGH (ref 6–23)
Glucose, Bld: 95 mg/dL (ref 70–99)
Total Bilirubin: 0.9 mg/dL (ref 0.3–1.2)

## 2011-12-29 LAB — IRON AND TIBC
TIBC: 220 ug/dL (ref 215–435)
UIBC: 152 ug/dL (ref 125–400)

## 2011-12-29 LAB — FERRITIN: Ferritin: 171 ng/mL (ref 22–322)

## 2011-12-29 NOTE — Progress Notes (Signed)
Hematology and Oncology Follow Up Visit  [name] [id number] [birth year] 78 y.o. 12/29/2011 10:08 AM  CC: [name]. Jens Som, MD, [facility]  Melida Quitter, M.D.    Principle Diagnosis:  A 78 year old a gentleman with a diagnosis of a multifactorial anemia, it is anemia of renal disease, possible early myelodysplasia.    Current therapy: He is on Aranesp 300 mcg every 4 weeks to keep his hemoglobin above 11.  Interim History:  Mr. [name] presents today for a followup visit.  It has continued to tolerate Aranesp fairly well without any major complications or toxicity.  He had not had any infusion or injection-related problems.  He had not had any pain or discomfort, his performance activity level, although limited, but really unchanged.  He does report some occasional fatigue and tiredness.  He does report some occasional exertional dyspnea but really no shortness of breath or chest pain associated with that at this time. He did not have any illnesses or any hospitalizations. No recent complications to aransep injections.    Medications: I have reviewed the patient's current medications. Current outpatient prescriptions:aspirin 81 MG tablet, Take 81 mg by mouth daily.  , Disp: , Rfl: ;  dorzolamide-timolol (COSOPT) 22.3-6.8 MG/ML ophthalmic solution, Place 1 drop into both eyes 2 (two) times daily.  , Disp: , Rfl: ;  esomeprazole (NEXIUM) 40 MG capsule, Take 40 mg by mouth daily.  , Disp: , Rfl: ;  felodipine (PLENDIL) 10 MG 24 hr tablet, Take 10 mg by mouth daily.  , Disp: , Rfl:  folic acid (FOLVITE) 400 MCG tablet, Take 400 mcg by mouth daily.  , Disp: , Rfl: ;  isosorbide mononitrate (IMDUR) 60 MG 24 hr tablet, Take 60 mg by mouth daily.  , Disp: , Rfl: ;  MICARDIS HCT 40-12.5 MG per tablet, TAKE 2 TABLETS BY MOUTH ONCE DAILY, Disp: 60 tablet, Rfl: 6;  Multiple Vitamin (MULTIVITAMIN) tablet, Take 1 tablet by mouth daily.  , Disp: , Rfl: ;  pravastatin (PRAVACHOL) 40 MG tablet, Take 40 mg by  mouth daily.  , Disp: , Rfl:  terazosin (HYTRIN) 10 MG capsule, Take 10 mg by mouth at bedtime.  , Disp: , Rfl:   Allergies: No Known Allergies  Past Medical History, Surgical history, Social history, and Family History were reviewed and updated.  Review of Systems: Constitutional:  Negative for fever, chills, night sweats, anorexia, weight loss, pain. Cardiovascular: no chest pain or dyspnea on exertion Respiratory: no cough, shortness of breath, or wheezing Neurological: no TIA or stroke symptoms Dermatological: negative ENT: negative Skin: Negative. Gastrointestinal: no abdominal pain, change in bowel habits, or black or bloody stools Genito-Urinary: no dysuria, trouble voiding, or hematuria Hematological and Lymphatic: negative Breast: negative Musculoskeletal: negative Remaining ROS negative. Physical Exam: Blood pressure 142/58, pulse 58, temperature 96.7 F (35.9 C), temperature source Oral, height 5\' 7"  (1.702 m), weight 155 lb 14.4 oz (70.716 kg). ECOG: 2 General appearance: alert Head: Normocephalic, without obvious abnormality, atraumatic Neck: no adenopathy, no carotid bruit, no JVD, supple, symmetrical, trachea midline and thyroid not enlarged, symmetric, no tenderness/mass/nodules Lymph nodes: Cervical, supraclavicular, and axillary nodes normal. Heart:regular rate and rhythm, S1, S2 normal, no murmur, click, rub or gallop Lung:chest clear, no wheezing, rales, normal symmetric air entry Abdomin: soft, non-tender, without masses or organomegaly EXT:no erythema, induration, or nodules   Lab Results: Lab Results  Component Value Date   WBC 3.2* 12/29/2011   HGB 9.5* 12/29/2011   HCT 27.9* 12/29/2011  MCV 97.3 12/29/2011   PLT 86* 12/29/2011     Chemistry      Component Value Date/Time   NA 139 01/19/2011 1533   K 4.7 01/19/2011 1533   CL 106 01/19/2011 1533   CO2 25 01/19/2011 1533   BUN 34* 01/19/2011 1533   CREATININE 1.82* 01/19/2011 1533      Component Value  Date/Time   CALCIUM 9.2 01/19/2011 1533   ALKPHOS 65 01/19/2011 1533   AST 16 01/19/2011 1533   ALT 10 01/19/2011 1533   BILITOT 1.4* 01/19/2011 1533       Impression and Plan:  A 78 year old gentleman with the following issues: 1. Anemia is multifactorial in nature.  He could have an element of early myelodysplasia.  Continue Aranesp every 4 weeks to keep his hemoglobin above 11. 2. Thrombocytopenia.  It is mild at this time, no bleeding noted. Continue to follow for the time being. 3. Follow up in 6 months.   [facility], MD 3/6/201310:08 AM

## 2011-12-29 NOTE — Telephone Encounter (Signed)
March-Sept made and printed for pt aom

## 2012-01-21 ENCOUNTER — Ambulatory Visit (HOSPITAL_BASED_OUTPATIENT_CLINIC_OR_DEPARTMENT_OTHER): Payer: Medicare Other

## 2012-01-21 ENCOUNTER — Other Ambulatory Visit (HOSPITAL_BASED_OUTPATIENT_CLINIC_OR_DEPARTMENT_OTHER): Payer: Medicare Other | Admitting: Lab

## 2012-01-21 VITALS — BP 138/51 | HR 56 | Temp 97.2°F

## 2012-01-21 DIAGNOSIS — E7800 Pure hypercholesterolemia, unspecified: Secondary | ICD-10-CM

## 2012-01-21 DIAGNOSIS — R0602 Shortness of breath: Secondary | ICD-10-CM

## 2012-01-21 DIAGNOSIS — I2510 Atherosclerotic heart disease of native coronary artery without angina pectoris: Secondary | ICD-10-CM

## 2012-01-21 DIAGNOSIS — D696 Thrombocytopenia, unspecified: Secondary | ICD-10-CM

## 2012-01-21 DIAGNOSIS — D649 Anemia, unspecified: Secondary | ICD-10-CM

## 2012-01-21 DIAGNOSIS — I6529 Occlusion and stenosis of unspecified carotid artery: Secondary | ICD-10-CM

## 2012-01-21 DIAGNOSIS — R5381 Other malaise: Secondary | ICD-10-CM

## 2012-01-21 DIAGNOSIS — N259 Disorder resulting from impaired renal tubular function, unspecified: Secondary | ICD-10-CM

## 2012-01-21 DIAGNOSIS — I701 Atherosclerosis of renal artery: Secondary | ICD-10-CM

## 2012-01-21 DIAGNOSIS — I10 Essential (primary) hypertension: Secondary | ICD-10-CM

## 2012-01-21 DIAGNOSIS — R079 Chest pain, unspecified: Secondary | ICD-10-CM

## 2012-01-21 DIAGNOSIS — I509 Heart failure, unspecified: Secondary | ICD-10-CM

## 2012-01-21 LAB — CBC WITH DIFFERENTIAL/PLATELET
BASO%: 0.4 % (ref 0.0–2.0)
Eosinophils Absolute: 0 10*3/uL (ref 0.0–0.5)
MCHC: 33.2 g/dL (ref 32.0–36.0)
MCV: 99.9 fL — ABNORMAL HIGH (ref 79.3–98.0)
MONO#: 0.1 10*3/uL (ref 0.1–0.9)
MONO%: 2.2 % (ref 0.0–14.0)
NEUT#: 1.7 10*3/uL (ref 1.5–6.5)
RBC: 2.78 10*6/uL — ABNORMAL LOW (ref 4.20–5.82)
RDW: 17.4 % — ABNORMAL HIGH (ref 11.0–14.6)
WBC: 2.6 10*3/uL — ABNORMAL LOW (ref 4.0–10.3)
nRBC: 0 % (ref 0–0)

## 2012-01-21 LAB — TECHNOLOGIST REVIEW

## 2012-01-21 MED ORDER — DARBEPOETIN ALFA-POLYSORBATE 300 MCG/0.6ML IJ SOLN
300.0000 ug | Freq: Once | INTRAMUSCULAR | Status: AC
  Administered 2012-01-21: 300 ug via SUBCUTANEOUS
  Filled 2012-01-21: qty 0.6

## 2012-02-02 ENCOUNTER — Telehealth: Payer: Self-pay | Admitting: Cardiology

## 2012-02-02 NOTE — Telephone Encounter (Signed)
Ok to hold ASA; I do not know cardiologists in that area [facility]

## 2012-02-02 NOTE — Telephone Encounter (Signed)
Spoke with In house Md Dr. Loralee Pacas at nursing home in [place]. He would like to have a surgical clearance for oral surgery on patient . Patient  has two abscess  Teeth and is on Cipro antibiotic, Md states patient needs to start Amoxicillin  Because the   abscess  still swollen and he is on Aspirin 81 mg and according to Dr. Inez Catalina,  these  two medications can not be given together, so pt needs to be off Aspirin for 7 days. Also MD is requesting a referral to a cardiologist in [place] or [place] if you know anyone there.

## 2012-02-02 NOTE — Telephone Encounter (Signed)
Pt's dtr moved pt to nursing home in [place], needs ok to be off asa for 7 days for teeth extraction to [facility] [phone] fax [phone], also requesting a referral to a cardiologist in [place] or [place] if he knows of anyone, pls call dtr

## 2012-02-08 ENCOUNTER — Encounter: Payer: Self-pay | Admitting: *Deleted

## 2012-02-08 NOTE — Telephone Encounter (Signed)
Fu call Pt's daughter called back name of dentist is [facility] needs ok to be faxed to [phone] to have  two teeth extracted and to hold aspirin. Please let her know when done

## 2012-02-08 NOTE — Telephone Encounter (Signed)
Spoke with pt dtr, aware letter faxed to the number provided.

## 2012-02-18 ENCOUNTER — Other Ambulatory Visit: Payer: Medicare Other | Admitting: Lab

## 2012-02-18 ENCOUNTER — Ambulatory Visit: Payer: Medicare Other

## 2012-03-17 ENCOUNTER — Ambulatory Visit: Payer: Medicare Other | Admitting: Oncology

## 2012-03-17 ENCOUNTER — Other Ambulatory Visit: Payer: Medicare Other | Admitting: Lab

## 2012-04-14 ENCOUNTER — Telehealth: Payer: Self-pay | Admitting: *Deleted

## 2012-04-14 ENCOUNTER — Other Ambulatory Visit: Payer: Medicare Other | Admitting: Lab

## 2012-04-14 ENCOUNTER — Telehealth: Payer: Self-pay | Admitting: Oncology

## 2012-04-14 ENCOUNTER — Other Ambulatory Visit: Payer: Medicare Other

## 2012-04-14 NOTE — Telephone Encounter (Signed)
Called patients phone number and recording stated that it was disconnected or changed.  Called [name] from his contact numbers and left message to call back.

## 2012-04-14 NOTE — Telephone Encounter (Signed)
Recd a v/m call from  [facility]/daughter that he is no longer coming here and to cx all appts   aom

## 2012-05-12 ENCOUNTER — Other Ambulatory Visit: Payer: Medicare Other | Admitting: Lab

## 2012-05-12 ENCOUNTER — Ambulatory Visit: Payer: Medicare Other

## 2012-06-09 ENCOUNTER — Ambulatory Visit: Payer: Medicare Other

## 2012-06-09 ENCOUNTER — Other Ambulatory Visit: Payer: Medicare Other | Admitting: Lab

## 2012-07-07 ENCOUNTER — Other Ambulatory Visit: Payer: Medicare Other | Admitting: Lab

## 2012-07-07 ENCOUNTER — Ambulatory Visit: Payer: Medicare Other | Admitting: Oncology

## 2012-12-23 DEATH — deceased

## 2014-08-13 ENCOUNTER — Telehealth: Payer: Self-pay

## 2014-08-13 NOTE — Telephone Encounter (Signed)
Patient died in [place] per [facility]

## 2016-04-03 ENCOUNTER — Other Ambulatory Visit: Payer: Self-pay | Admitting: Nurse Practitioner
